# Patient Record
Sex: Female | Born: 1960 | Race: White | Hispanic: No | Marital: Married | Smoking: Never smoker
Health system: Southern US, Community
[De-identification: ages and names within clinical notes are randomized; demographics above are authoritative.]

## PROBLEM LIST (undated history)

## (undated) DIAGNOSIS — J45909 Unspecified asthma, uncomplicated: Secondary | ICD-10-CM

## (undated) DIAGNOSIS — T7840XA Allergy, unspecified, initial encounter: Secondary | ICD-10-CM

## (undated) DIAGNOSIS — G43909 Migraine, unspecified, not intractable, without status migrainosus: Secondary | ICD-10-CM

## (undated) HISTORY — DX: Allergy, unspecified, initial encounter: T78.40XA

## (undated) HISTORY — DX: Migraine, unspecified, not intractable, without status migrainosus: G43.909

## (undated) HISTORY — DX: Unspecified asthma, uncomplicated: J45.909

---

## 1998-04-15 ENCOUNTER — Other Ambulatory Visit: Admission: RE | Admit: 1998-04-15 | Discharge: 1998-04-15 | Payer: Self-pay | Admitting: Obstetrics and Gynecology

## 1998-05-18 HISTORY — PX: ABDOMINAL HYSTERECTOMY: SHX81

## 1999-04-07 ENCOUNTER — Ambulatory Visit: Admission: RE | Admit: 1999-04-07 | Discharge: 1999-04-07 | Payer: Self-pay | Admitting: Obstetrics and Gynecology

## 1999-05-27 ENCOUNTER — Inpatient Hospital Stay (HOSPITAL_COMMUNITY): Admission: RE | Admit: 1999-05-27 | Discharge: 1999-05-29 | Payer: Self-pay | Admitting: Obstetrics and Gynecology

## 1999-11-11 ENCOUNTER — Other Ambulatory Visit: Admission: RE | Admit: 1999-11-11 | Discharge: 1999-11-11 | Payer: Self-pay | Admitting: Obstetrics and Gynecology

## 2000-11-19 ENCOUNTER — Other Ambulatory Visit: Admission: RE | Admit: 2000-11-19 | Discharge: 2000-11-19 | Payer: Self-pay | Admitting: Obstetrics and Gynecology

## 2000-11-19 ENCOUNTER — Encounter: Admission: RE | Admit: 2000-11-19 | Discharge: 2000-11-19 | Payer: Self-pay | Admitting: Obstetrics and Gynecology

## 2000-11-19 ENCOUNTER — Encounter: Payer: Self-pay | Admitting: Obstetrics and Gynecology

## 2002-01-11 ENCOUNTER — Other Ambulatory Visit: Admission: RE | Admit: 2002-01-11 | Discharge: 2002-01-11 | Payer: Self-pay | Admitting: Obstetrics and Gynecology

## 2003-04-16 ENCOUNTER — Encounter: Admission: RE | Admit: 2003-04-16 | Discharge: 2003-04-16 | Payer: Self-pay | Admitting: Obstetrics and Gynecology

## 2003-04-17 ENCOUNTER — Other Ambulatory Visit: Admission: RE | Admit: 2003-04-17 | Discharge: 2003-04-17 | Payer: Self-pay | Admitting: Obstetrics and Gynecology

## 2004-04-16 ENCOUNTER — Encounter: Admission: RE | Admit: 2004-04-16 | Discharge: 2004-04-16 | Payer: Self-pay | Admitting: Obstetrics and Gynecology

## 2005-04-22 ENCOUNTER — Encounter: Admission: RE | Admit: 2005-04-22 | Discharge: 2005-04-22 | Payer: Self-pay | Admitting: Obstetrics and Gynecology

## 2006-04-29 ENCOUNTER — Encounter: Admission: RE | Admit: 2006-04-29 | Discharge: 2006-04-29 | Payer: Self-pay | Admitting: Obstetrics and Gynecology

## 2007-05-02 ENCOUNTER — Encounter: Admission: RE | Admit: 2007-05-02 | Discharge: 2007-05-02 | Payer: Self-pay | Admitting: Obstetrics and Gynecology

## 2008-06-04 ENCOUNTER — Encounter: Admission: RE | Admit: 2008-06-04 | Discharge: 2008-06-04 | Payer: Self-pay | Admitting: Obstetrics and Gynecology

## 2009-08-15 ENCOUNTER — Encounter: Admission: RE | Admit: 2009-08-15 | Discharge: 2009-08-15 | Payer: Self-pay | Admitting: Obstetrics and Gynecology

## 2010-09-17 ENCOUNTER — Other Ambulatory Visit: Payer: Self-pay | Admitting: Obstetrics and Gynecology

## 2010-09-17 DIAGNOSIS — Z1231 Encounter for screening mammogram for malignant neoplasm of breast: Secondary | ICD-10-CM

## 2010-10-21 ENCOUNTER — Ambulatory Visit
Admission: RE | Admit: 2010-10-21 | Discharge: 2010-10-21 | Disposition: A | Discharge status: Home / Self Care | Payer: BC Managed Care – PPO | Source: Ambulatory Visit | Attending: Obstetrics and Gynecology | Admitting: Obstetrics and Gynecology

## 2010-10-21 DIAGNOSIS — Z1231 Encounter for screening mammogram for malignant neoplasm of breast: Secondary | ICD-10-CM

## 2011-10-22 ENCOUNTER — Other Ambulatory Visit: Payer: Self-pay | Admitting: Obstetrics and Gynecology

## 2011-10-22 DIAGNOSIS — Z1231 Encounter for screening mammogram for malignant neoplasm of breast: Secondary | ICD-10-CM

## 2011-11-04 ENCOUNTER — Ambulatory Visit: Payer: BC Managed Care – PPO

## 2011-11-05 ENCOUNTER — Ambulatory Visit
Admission: RE | Admit: 2011-11-05 | Discharge: 2011-11-05 | Disposition: A | Discharge status: Home / Self Care | Payer: BC Managed Care – PPO | Source: Ambulatory Visit | Attending: Obstetrics and Gynecology | Admitting: Obstetrics and Gynecology

## 2011-11-05 DIAGNOSIS — Z1231 Encounter for screening mammogram for malignant neoplasm of breast: Secondary | ICD-10-CM

## 2012-09-29 ENCOUNTER — Other Ambulatory Visit: Payer: Self-pay

## 2012-09-29 DIAGNOSIS — Z1231 Encounter for screening mammogram for malignant neoplasm of breast: Secondary | ICD-10-CM

## 2012-11-07 ENCOUNTER — Ambulatory Visit
Admission: RE | Admit: 2012-11-07 | Discharge: 2012-11-07 | Disposition: A | Discharge status: Home / Self Care | Payer: BC Managed Care – PPO | Source: Ambulatory Visit

## 2012-11-07 DIAGNOSIS — Z1231 Encounter for screening mammogram for malignant neoplasm of breast: Secondary | ICD-10-CM

## 2012-11-08 ENCOUNTER — Other Ambulatory Visit: Payer: Self-pay | Admitting: Obstetrics and Gynecology

## 2012-11-08 DIAGNOSIS — R928 Other abnormal and inconclusive findings on diagnostic imaging of breast: Secondary | ICD-10-CM

## 2012-11-21 ENCOUNTER — Ambulatory Visit
Admission: RE | Admit: 2012-11-21 | Discharge: 2012-11-21 | Disposition: A | Discharge status: Home / Self Care | Payer: BC Managed Care – PPO | Source: Ambulatory Visit | Attending: Obstetrics and Gynecology | Admitting: Obstetrics and Gynecology

## 2012-11-21 DIAGNOSIS — R928 Other abnormal and inconclusive findings on diagnostic imaging of breast: Secondary | ICD-10-CM

## 2013-06-05 ENCOUNTER — Other Ambulatory Visit: Payer: Self-pay | Admitting: Obstetrics and Gynecology

## 2013-06-05 DIAGNOSIS — N6310 Unspecified lump in the right breast, unspecified quadrant: Secondary | ICD-10-CM

## 2013-06-09 ENCOUNTER — Ambulatory Visit
Admission: RE | Admit: 2013-06-09 | Discharge: 2013-06-09 | Disposition: A | Discharge status: Home / Self Care | Payer: BC Managed Care – PPO | Source: Ambulatory Visit | Attending: Obstetrics and Gynecology | Admitting: Obstetrics and Gynecology

## 2013-06-09 DIAGNOSIS — N6310 Unspecified lump in the right breast, unspecified quadrant: Secondary | ICD-10-CM

## 2013-12-08 ENCOUNTER — Other Ambulatory Visit: Payer: Self-pay

## 2013-12-08 DIAGNOSIS — Z1231 Encounter for screening mammogram for malignant neoplasm of breast: Secondary | ICD-10-CM

## 2014-01-08 ENCOUNTER — Encounter (INDEPENDENT_AMBULATORY_CARE_PROVIDER_SITE_OTHER): Payer: Self-pay

## 2014-01-08 ENCOUNTER — Ambulatory Visit
Admission: RE | Admit: 2014-01-08 | Discharge: 2014-01-08 | Disposition: A | Discharge status: Home / Self Care | Payer: BC Managed Care – PPO | Source: Ambulatory Visit

## 2014-01-08 DIAGNOSIS — Z1231 Encounter for screening mammogram for malignant neoplasm of breast: Secondary | ICD-10-CM

## 2015-04-16 ENCOUNTER — Other Ambulatory Visit: Payer: Self-pay

## 2015-04-16 DIAGNOSIS — Z1231 Encounter for screening mammogram for malignant neoplasm of breast: Secondary | ICD-10-CM

## 2015-05-02 ENCOUNTER — Ambulatory Visit
Admission: RE | Admit: 2015-05-02 | Discharge: 2015-05-02 | Disposition: A | Discharge status: Home / Self Care | Payer: BC Managed Care – PPO | Source: Ambulatory Visit

## 2015-05-02 DIAGNOSIS — Z1231 Encounter for screening mammogram for malignant neoplasm of breast: Secondary | ICD-10-CM

## 2015-06-10 ENCOUNTER — Ambulatory Visit: Payer: BC Managed Care – PPO | Admitting: Nurse Practitioner

## 2015-07-01 ENCOUNTER — Encounter: Payer: Self-pay | Admitting: Nurse Practitioner

## 2015-07-01 ENCOUNTER — Ambulatory Visit (INDEPENDENT_AMBULATORY_CARE_PROVIDER_SITE_OTHER): Payer: BC Managed Care – PPO | Admitting: Nurse Practitioner

## 2015-07-01 VITALS — BP 100/82 | HR 74 | Temp 97.9°F | Ht 65.5 in | Wt 181.5 lb

## 2015-07-01 DIAGNOSIS — J4520 Mild intermittent asthma, uncomplicated: Secondary | ICD-10-CM

## 2015-07-01 DIAGNOSIS — Z79890 Hormone replacement therapy (postmenopausal): Secondary | ICD-10-CM | POA: Diagnosis not present

## 2015-07-01 DIAGNOSIS — Z7689 Persons encountering health services in other specified circumstances: Secondary | ICD-10-CM

## 2015-07-01 DIAGNOSIS — Z7189 Other specified counseling: Secondary | ICD-10-CM | POA: Diagnosis not present

## 2015-07-01 DIAGNOSIS — G43809 Other migraine, not intractable, without status migrainosus: Secondary | ICD-10-CM

## 2015-07-01 MED ORDER — ESTRADIOL 0.05 MG/24HR TD PTWK: 0.0500 mg | MEDICATED_PATCH | TRANSDERMAL | Status: DC

## 2015-07-01 NOTE — Progress Notes (Signed)
Patient ID: Kathleen Santos, female    DOB: May 11, 1961  Age: 55 y.o. MRN: [id number]  CC: Establish Care   HPI Kathleen Santos presents for CC of establishing care and CC of estrogen patches.   1) New Pt Info:   Immunizations- UTD  Mammogram- Dec. 2016   Pap- 2015 OB/GYN Dr. Billy Santos   Colonoscopy- Had scheduled twice, but couldn't take the prep   Eye exam [facility]- glasses   2) Chronic Problems- Asthma- Childhood  Allergies- Had allergy shots, allergy meds OTC in the fall  UTI's infrequent  Migraines- started having them in last 2 years, 2 episodes   Auras, vomiting, photophobia  Ibuprofen and rest helpful, not tried other medications, does not wish to start any new medications at this time  3) Acute Problems- Estrogen Patches- has been on different strengths since her hysterectomy in 2000. Her PCP switched her from 0.1 to 0.075 the next lowest dosage. She had her first hot flash the other day and it had not happened before. She also notices some "grouchiness" when the patch is due to be changed. She would like to continue to wean off of these due to side effects and adverse reactions.   History Kathleen Santos has no past medical history on file.   She has past surgical history that includes Abdominal hysterectomy.   Her family history includes COPD in her father; Cancer in her father; Diabetes in her mother; Heart disease in her mother; Osteoarthritis in her mother; Stroke (age of onset: 34) in her mother; Throat cancer in her father.She reports that she has never smoked. She does not have any smokeless tobacco history on file. She reports that she does not drink alcohol or use illicit drugs.  No outpatient prescriptions prior to visit.   No facility-administered medications prior to visit.    ROS Review of Systems  Constitutional: Negative for fever, chills, diaphoresis and fatigue.  Respiratory: Negative for chest tightness, shortness of breath and wheezing.    Cardiovascular: Negative for chest pain, palpitations and leg swelling.  Gastrointestinal: Negative for nausea, vomiting and diarrhea.  Skin: Negative for rash.  Neurological: Positive for headaches. Negative for dizziness, weakness and numbness.  Psychiatric/Behavioral: The patient is not nervous/anxious.     Objective:  BP 100/82 mmHg  Pulse 74  Temp(Src) 97.9 F (36.6 C) (Oral)  Ht 5' 5.5" (1.664 m)  Wt 181 lb 8 oz (82.328 kg)  BMI 29.73 kg/m2  SpO2 98%  Physical Exam  Constitutional: She is oriented to person, place, and time. She appears well-developed and well-nourished. No distress.  HENT:  Head: Normocephalic and atraumatic.  Right Ear: External ear normal.  Left Ear: External ear normal.  Cardiovascular: Normal rate, regular rhythm and normal heart sounds.  Exam reveals no gallop and no friction rub.   No murmur heard. Pulmonary/Chest: Effort normal and breath sounds normal. No respiratory distress. She has no wheezes. She has no rales. She exhibits no tenderness.  Neurological: She is alert and oriented to person, place, and time. No cranial nerve deficit. She exhibits normal muscle tone. Coordination normal.  Skin: Skin is warm and dry. No rash noted. She is not diaphoretic.  Psychiatric: She has a normal mood and affect. Her behavior is normal. Judgment and thought content normal.   Assessment & Plan:   There are no diagnoses linked to this encounter. I am having Kathleen Santos maintain her estradiol.  Meds ordered this encounter  Medications  . estradiol (CLIMARA - DOSED IN  MG/24 HR) 0.075 mg/24hr patch    Sig: Place 0.075 mg onto the skin 2 (two) times a week.     Follow-up: No Follow-up on file.

## 2015-07-01 NOTE — Patient Instructions (Signed)
Welcome to [facility]! Nice to meet you.   See you when you need [place]!

## 2015-07-01 NOTE — Progress Notes (Signed)
Pre visit review using our clinic review tool, if applicable. No additional management support is needed unless otherwise documented below in the visit note. 

## 2015-07-05 DIAGNOSIS — Z7689 Persons encountering health services in other specified circumstances: Secondary | ICD-10-CM | POA: Insufficient documentation

## 2015-07-05 DIAGNOSIS — Z79890 Hormone replacement therapy (postmenopausal): Secondary | ICD-10-CM | POA: Insufficient documentation

## 2015-07-05 DIAGNOSIS — J45909 Unspecified asthma, uncomplicated: Secondary | ICD-10-CM | POA: Insufficient documentation

## 2015-07-05 DIAGNOSIS — G43909 Migraine, unspecified, not intractable, without status migrainosus: Secondary | ICD-10-CM | POA: Insufficient documentation

## 2015-07-05 NOTE — Assessment & Plan Note (Signed)
Discussed acute and chronic issues. Reviewed health maintenance measures, PFSHx, and immunizations. Obtain records from previous facility.   

## 2015-07-05 NOTE — Assessment & Plan Note (Signed)
New problem to me Patient has started them within last 2 years and has had 2 episodes approximately Patient does have are auras, vomiting, photophobia Will follow as needed

## 2015-07-05 NOTE — Assessment & Plan Note (Signed)
New problem to me Patient has started on estrogen patches back in 2000 by her OB/GYN Patient was decreased to the next level by her PCP back last year Patient reports that she is having some grouchiness and had one hot flash episode recently She would like to continue to wean off of these because she is afraid of heart attack and stroke risks I have called in the next lower dose to her pharmacy to start whenever she is ready will decrease over the next several months

## 2015-07-05 NOTE — Assessment & Plan Note (Signed)
Patient is stable currently Has not used an inhaler since childhood Patient also went through allergy shots and uses OTC allergy meds in the fall which is helpful for patient We'll follow as needed

## 2016-03-10 ENCOUNTER — Other Ambulatory Visit: Payer: Self-pay | Admitting: Obstetrics and Gynecology

## 2016-03-10 DIAGNOSIS — Z1231 Encounter for screening mammogram for malignant neoplasm of breast: Secondary | ICD-10-CM

## 2016-05-08 ENCOUNTER — Ambulatory Visit
Admission: RE | Admit: 2016-05-08 | Discharge: 2016-05-08 | Disposition: A | Discharge status: Home / Self Care | Payer: Self-pay | Source: Ambulatory Visit | Attending: Obstetrics and Gynecology | Admitting: Obstetrics and Gynecology

## 2016-05-08 DIAGNOSIS — Z1231 Encounter for screening mammogram for malignant neoplasm of breast: Secondary | ICD-10-CM

## 2016-05-20 ENCOUNTER — Encounter: Payer: Self-pay | Admitting: Internal Medicine

## 2016-05-20 ENCOUNTER — Ambulatory Visit (INDEPENDENT_AMBULATORY_CARE_PROVIDER_SITE_OTHER): Payer: BC Managed Care – PPO | Admitting: Internal Medicine

## 2016-05-20 DIAGNOSIS — G43809 Other migraine, not intractable, without status migrainosus: Secondary | ICD-10-CM

## 2016-05-20 DIAGNOSIS — R232 Flushing: Secondary | ICD-10-CM | POA: Diagnosis not present

## 2016-05-20 MED ORDER — ESTRADIOL 0.05 MG/24HR TD PTWK: 0.0500 mg | MEDICATED_PATCH | TRANSDERMAL | 11 refills | Status: DC

## 2016-05-20 NOTE — Assessment & Plan Note (Signed)
Estradiol patch refilled today

## 2016-05-20 NOTE — Progress Notes (Signed)
HPI  Pt presents to the clinic today to establish care. She is transferring care from [facility].   Seasonal Allergies: She reports she took allergy shots for a long time in high school. She has not had any issues since college. She does not taken anything OTC for this.  Migraines: She reports she has not had a migraine in the last 6 months. They are usually located on the top of her head. She does have associated sensitivity to light, nausea and migraines. She takes Excedrin Migraine with good relief.   Hot Flashes: She takes Estradiol patch with good relief. She is requesting a refill of this today.  Flu: 03/2016 Tetanus: 05/2014 Pap Smear: 2015, total hysterectomy Mammogram: [facility] Colon Screening: never Vision Screening: annually 05/2015, has an appt scheduled for 05/2016 Dentist: biannually   Past Medical History:  Diagnosis Date  . Allergy   . Asthma    Childhood  . Migraine     Current Outpatient Prescriptions  Medication Sig Dispense Refill  . estradiol (CLIMARA - DOSED IN MG/24 HR) 0.05 mg/24hr patch Place 1 patch (0.05 mg total) onto the skin once a week. 4 patch 11  . Multiple Vitamin (MULTIVITAMIN) tablet Take 1 tablet by mouth daily.     No current facility-administered medications for this visit.     No Known Allergies  Family History  Problem Relation Age of Onset  . Stroke Mother 73  . Osteoarthritis Mother   . Diabetes Mother   . Heart disease Mother   . Arthritis Mother   . COPD Father   . Throat cancer Father   . Cancer Father   . Heart disease Father   . Cancer Sister     Breast  . Cancer Brother     Prostate  . Cancer Sister     Breast    Social History   Social History  . Marital status: Married    Spouse name: N/A  . Number of children: N/A  . Years of education: N/A   Occupational History  . Not on file.   Social History Main Topics  . Smoking status: Never Smoker  . Smokeless tobacco: Never Used  . Alcohol  use No  . Drug use: No  . Sexual activity: Yes    Partners: Male    Birth control/ protection: Surgical     Comment: Husband   Other Topics Concern  . Not on file   Social History Narrative   Married   1 year of college   [employer] healthcare- Surgery scheduler    No Children    Caffeine- coffee 2 cups, tea prn   Counseling- 17 years ago after mother passing     ROS:  Constitutional: Denies fever, malaise, fatigue, headache or abrupt weight changes.  HEENT: Denies eye pain, eye redness, ear pain, ringing in the ears, wax buildup, runny nose, nasal congestion, bloody nose, or sore throat. Respiratory: Denies difficulty breathing, shortness of breath, cough or sputum production.   Cardiovascular: Denies chest pain, chest tightness, palpitations or swelling in the hands or feet.  Gastrointestinal: Denies abdominal pain, bloating, constipation, diarrhea or blood in the stool.  GU: Denies frequency, urgency, pain with urination, blood in urine, odor or discharge. Musculoskeletal: Denies decrease in range of motion, difficulty with gait, muscle pain or joint pain and swelling.  Skin: Denies redness, rashes, lesions or ulcercations.  Neurological: Pt reports hot flashes. Denies dizziness, difficulty with memory, difficulty with speech or problems with balance  and coordination.  Psych: Denies anxiety, depression, SI/HI.  No other specific complaints in a complete review of systems (except as listed in HPI above).  PE:  BP 108/70   Pulse 71   Temp 98.1 F (36.7 C) (Oral)   Ht 5\' 5"  (1.651 m)   Wt 188 lb (85.3 kg)   SpO2 97%   BMI 31.28 kg/m  Wt Readings from Last 3 Encounters:  05/20/16 188 lb (85.3 kg)  07/01/15 181 lb 8 oz (82.3 kg)    General: Appears herstated age, well developed, well nourished in NAD. Cardiovascular: Normal rate and rhythm. S1,S2 noted.   Pulmonary/Chest: Normal effort and positive vesicular breath sounds. No respiratory distress. No wheezes, rales or  ronchi noted.  Musculoskeletal:  No difficulty with gait.  Neurological: Alert and oriented.  Psychiatric: Mood and affect normal. Behavior is normal. Judgment and thought content normal.    Assessment and Plan:

## 2016-05-20 NOTE — Patient Instructions (Signed)
Menopause Menopause is the normal time of life when menstrual periods stop completely. Menopause is complete when you have missed 12 consecutive menstrual periods. It usually occurs between the ages of 48 years and 55 years. Very rarely does a woman develop menopause before the age of 40 years. At menopause, your ovaries stop producing the female hormones estrogen and progesterone. This can cause undesirable symptoms and also affect your health. Sometimes the symptoms may occur 4-5 years before the menopause begins. There is no relationship between menopause and:  Oral contraceptives.  Number of children you had.  Race.  The age your menstrual periods started (menarche).  Heavy smokers and very thin women may develop menopause earlier in life. What are the causes?  The ovaries stop producing the female hormones estrogen and progesterone. Other causes include:  Surgery to remove both ovaries.  The ovaries stop functioning for no known reason.  Tumors of the pituitary gland in the brain.  Medical disease that affects the ovaries and hormone production.  Radiation treatment to the abdomen or pelvis.  Chemotherapy that affects the ovaries.  What are the signs or symptoms?  Hot flashes.  Night sweats.  Decrease in sex drive.  Vaginal dryness and thinning of the vagina causing painful intercourse.  Dryness of the skin and developing wrinkles.  Headaches.  Tiredness.  Irritability.  Memory problems.  Weight gain.  Bladder infections.  Hair growth of the face and chest.  Infertility. More serious symptoms include:  Loss of bone (osteoporosis) causing breaks (fractures).  Depression.  Hardening and narrowing of the arteries (atherosclerosis) causing heart attacks and strokes.  How is this diagnosed?  When the menstrual periods have stopped for 12 straight months.  Physical exam.  Hormone studies of the blood. How is this treated? There are many treatment  choices and nearly as many questions about them. The decisions to treat or not to treat menopausal changes is an individual choice made with your health care provider. Your health care provider can discuss the treatments with you. Together, you can decide which treatment will work best for you. Your treatment choices may include:  Hormone therapy (estrogen and progesterone).  Non-hormonal medicines.  Treating the individual symptoms with medicine (for example antidepressants for depression).  Herbal medicines that may help specific symptoms.  Counseling by a psychiatrist or psychologist.  Group therapy.  Lifestyle changes including: ? Eating healthy. ? Regular exercise. ? Limiting caffeine and alcohol. ? Stress management and meditation.  No treatment.  Follow these instructions at home:  Take the medicine your health care provider gives you as directed.  Get plenty of sleep and rest.  Exercise regularly.  Eat a diet that contains calcium (good for the bones) and soy products (acts like estrogen hormone).  Avoid alcoholic beverages.  Do not smoke.  If you have hot flashes, dress in layers.  Take supplements, calcium, and vitamin D to strengthen bones.  You can use over-the-counter lubricants or moisturizers for vaginal dryness.  Group therapy is sometimes very helpful.  Acupuncture may be helpful in some cases. Contact a health care provider if:  You are not sure you are in menopause.  You are having menopausal symptoms and need advice and treatment.  You are still having menstrual periods after age 55 years.  You have pain with intercourse.  Menopause is complete (no menstrual period for 12 months) and you develop vaginal bleeding.  You need a referral to a specialist (gynecologist, psychiatrist, or psychologist) for treatment. Get help right   away if:  You have severe depression.  You have excessive vaginal bleeding.  You fell and think you have a  broken bone.  You have pain when you urinate.  You develop leg or chest pain.  You have a fast pounding heart beat (palpitations).  You have severe headaches.  You develop vision problems.  You feel a lump in your breast.  You have abdominal pain or severe indigestion. This information is not intended to replace advice given to you by your health care provider. Make sure you discuss any questions you have with your health care provider. Document Released: 07/25/2003 Document Revised: 10/10/2015 Document Reviewed: 12/01/2012 Elsevier Interactive Patient Education  2017 Elsevier Inc.  

## 2016-05-20 NOTE — Assessment & Plan Note (Signed)
Controlled with Excedrin Migraine Will monitor 

## 2016-06-05 ENCOUNTER — Ambulatory Visit: Payer: Self-pay | Admitting: Family Medicine

## 2016-06-24 ENCOUNTER — Other Ambulatory Visit: Payer: Self-pay | Admitting: Nurse Practitioner

## 2016-06-24 NOTE — Telephone Encounter (Signed)
Pt last refill on estradiol was on 05/27/16. Pt has no appt to est care with new PCP.

## 2016-08-31 ENCOUNTER — Encounter: Payer: Self-pay | Admitting: Internal Medicine

## 2016-08-31 ENCOUNTER — Telehealth: Payer: Self-pay

## 2016-08-31 ENCOUNTER — Ambulatory Visit (INDEPENDENT_AMBULATORY_CARE_PROVIDER_SITE_OTHER): Payer: BC Managed Care – PPO | Admitting: Internal Medicine

## 2016-08-31 VITALS — BP 104/70 | HR 71 | Temp 98.3°F | Ht 65.0 in | Wt 183.5 lb

## 2016-08-31 DIAGNOSIS — Z780 Asymptomatic menopausal state: Secondary | ICD-10-CM

## 2016-08-31 DIAGNOSIS — K219 Gastro-esophageal reflux disease without esophagitis: Secondary | ICD-10-CM

## 2016-08-31 DIAGNOSIS — Z13820 Encounter for screening for osteoporosis: Secondary | ICD-10-CM

## 2016-08-31 DIAGNOSIS — Z0000 Encounter for general adult medical examination without abnormal findings: Secondary | ICD-10-CM | POA: Diagnosis not present

## 2016-08-31 LAB — COMPREHENSIVE METABOLIC PANEL
ALBUMIN: 4 g/dL (ref 3.5–5.2)
ALK PHOS: 50 U/L (ref 39–117)
ALT: 13 U/L (ref 0–35)
AST: 21 U/L (ref 0–37)
BUN: 17 mg/dL (ref 6–23)
CO2: 28 mEq/L (ref 19–32)
Calcium: 9.2 mg/dL (ref 8.4–10.5)
Chloride: 107 mEq/L (ref 96–112)
Creatinine, Ser: 0.93 mg/dL (ref 0.40–1.20)
GFR: 66.21 mL/min (ref >60.00)
Glucose, Bld: 90 mg/dL (ref 70–99)
Potassium: 4.2 mEq/L (ref 3.5–5.1)
SODIUM: 141 meq/L (ref 135–145)
TOTAL PROTEIN: 6.9 g/dL (ref 6.0–8.3)
Total Bilirubin: 0.4 mg/dL (ref 0.2–1.2)

## 2016-08-31 LAB — LIPID PANEL
Cholesterol: 139 mg/dL (ref 0–200)
HDL: 47.8 mg/dL (ref >39.00)
LDL CALC: 76 mg/dL (ref 0–99)
NONHDL: 91.19
Total CHOL/HDL Ratio: 3
Triglycerides: 75 mg/dL (ref 0.0–149.0)
VLDL: 15 mg/dL (ref 0.0–40.0)

## 2016-08-31 LAB — CBC
HCT: 40.1 % (ref 36.0–46.0)
Hemoglobin: 13.3 g/dL (ref 12.0–15.0)
MCHC: 33.1 g/dL (ref 30.0–36.0)
MCV: 91.2 fl (ref 78.0–100.0)
Platelets: 303 10*3/uL (ref 150.0–400.0)
RBC: 4.4 Mil/uL (ref 3.87–5.11)
RDW: 13.7 % (ref 11.5–15.5)
WBC: 6.5 10*3/uL (ref 4.0–10.5)

## 2016-08-31 LAB — VITAMIN D 25 HYDROXY (VIT D DEFICIENCY, FRACTURES): VITD: 31.28 ng/mL (ref 30.00–100.00)

## 2016-08-31 NOTE — Progress Notes (Signed)
Subjective:    Patient ID: Kathleen Santos, female    DOB: 07-10-1960, 56 y.o.   MRN: [id number]  HPI  Pt presents to the clinic today for her annual exam.  Flu: 03/2016 Tetanus: 05/2014 Pap Smear: 2015, total hysterectomy Mammogram: 04/2016 Bone Density: never Colon Screening: never Vision Screening: annually, 05/2016 Dentist: biannually  Diet: She does eat meat. She consumes fruits and veggies daily. She tries to avoid fried foods. She drinks mostly water. Exercise: She is walking for about 30 minutes 5 days a week.  Review of Systems      Past Medical History:  Diagnosis Date  . Allergy   . Asthma    Childhood  . Migraine     Current Outpatient Prescriptions  Medication Sig Dispense Refill  . estradiol (CLIMARA - DOSED IN MG/24 HR) 0.05 mg/24hr patch PLACE 1 PATCH (0.05 MG TOTAL) ONTO THE SKIN ONCE A WEEK. 4 patch 1  . Multiple Vitamin (MULTIVITAMIN) tablet Take 1 tablet by mouth daily.     No current facility-administered medications for this visit.     No Known Allergies  Family History  Problem Relation Age of Onset  . Stroke Mother 51  . Osteoarthritis Mother   . Diabetes Mother   . Heart disease Mother   . Arthritis Mother   . COPD Father   . Throat cancer Father   . Cancer Father   . Heart disease Father   . Cancer Sister     Breast  . Cancer Brother     Prostate  . Cancer Sister     Breast    Social History   Social History  . Marital status: Married    Spouse name: N/A  . Number of children: N/A  . Years of education: N/A   Occupational History  . Not on file.   Social History Main Topics  . Smoking status: Never Smoker  . Smokeless tobacco: Never Used  . Alcohol use No  . Drug use: No  . Sexual activity: Yes    Partners: Male    Birth control/ protection: Surgical     Comment: Husband   Other Topics Concern  . Not on file   Social History Narrative   Married   1 year of college   [employer] healthcare- Surgery scheduler     No Children    Caffeine- coffee 2 cups, tea prn   Counseling- 17 years ago after mother passing      Constitutional: Denies fever, malaise, fatigue, headache or abrupt weight changes.  HEENT: Denies eye pain, eye redness, ear pain, ringing in the ears, wax buildup, runny nose, nasal congestion, bloody nose, or sore throat. Respiratory: Denies difficulty breathing, shortness of breath, cough or sputum production.   Cardiovascular: Denies chest pain, chest tightness, palpitations or swelling in the hands or feet.  Gastrointestinal: Pt reports intermittent reflux. Denies abdominal pain, bloating, constipation, diarrhea or blood in the stool.  GU: Denies urgency, frequency, pain with urination, burning sensation, blood in urine, odor or discharge. Musculoskeletal: Denies decrease in range of motion, difficulty with gait, muscle pain or joint pain and swelling.  Skin: Denies redness, rashes, lesions or ulcercations.  Neurological: Pt reports intermittent hot flashes at night. Denies dizziness, difficulty with memory, difficulty with speech or problems with balance and coordination.  Psych: Pt reports intermittent anxiety. Denies depression, SI/HI.  No other specific complaints in a complete review of systems (except as listed in HPI above).  Objective:   Physical Exam  BP 104/70   Pulse 71   Temp 98.3 F (36.8 C) (Oral)   Ht  (1.651 m)   Wt 183 lb 8 oz (83.2 kg)   SpO2 98%   BMI 30.54 kg/m  Wt Readings from Last 3 Encounters:  08/31/16 183 lb 8 oz (83.2 kg)  05/20/16 188 lb (85.3 kg)  07/01/15 181 lb 8 oz (82.3 kg)    General: Appears her stated age, obese in NAD. Skin: Warm, dry and intact.  HEENT: Head: normal shape and size; Eyes: sclera white, no icterus, conjunctiva pink, PERRLA and EOMs intact; Ears: Tm's gray and intact, normal light reflex;Throat/Mouth: Teeth present, mucosa pink and moist, no exudate, lesions or ulcerations noted.  Neck:  Neck supple, trachea  midline. No masses, lumps or thyromegaly present.  Cardiovascular: Normal rate and rhythm. S1,S2 noted.  No murmur, rubs or gallops noted. No JVD or BLE edema. No carotid bruits noted. Pulmonary/Chest: Normal effort and positive vesicular breath sounds. No respiratory distress. No wheezes, rales or ronchi noted.  Abdomen: Soft and nontender. Normal bowel sounds. No distention or masses noted. Liver, spleen and kidneys non palpable. Musculoskeletal: Strength 5/5 BUE/BLE. No difficulty with gait.  Neurological: Alert and oriented. Cranial nerves II-XII grossly intact. Coordination normal.  Psychiatric: Mood and affect normal. Behavior is normal. Judgment and thought content normal.      Assessment & Plan:   Preventative Health Maintenance:  Flu and Tetanus UTD She does not need pap smear's Mammogram due 04/2017 Bone density ordered, she will schedule with mammogram 04/2017 She declines colonoscopy but is agreeable to Cologuard- ordered Encouraged her to consume a balanced diet and exercise regimen Advised her to see an eye doctor and dentist annually Will check CBC, CMET, Lipid and Vit D today  GERD:  Advised her to try to pay attention to her diet and see what triggers her symptoms, and avoid those things if possible Ok to start Prilosec OTC  RTC in 1 year, sooner if needed Nicki Reaper, NP

## 2016-08-31 NOTE — Telephone Encounter (Signed)
Cologuard form has been faxed w/ demographics and insurance card attached 

## 2016-08-31 NOTE — Patient Instructions (Signed)
Health Maintenance for Postmenopausal Women Menopause is a normal process in which your reproductive ability comes to an end. This process happens gradually over a span of months to years, usually between the ages of 33 and 38. Menopause is complete when you have missed 12 consecutive menstrual periods. It is important to talk with your health care provider about some of the most common conditions that affect postmenopausal women, such as heart disease, cancer, and bone loss (osteoporosis). Adopting a healthy lifestyle and getting preventive care can help to promote your health and wellness. Those actions can also lower your chances of developing some of these common conditions. What should I know about menopause? During menopause, you may experience a number of symptoms, such as:  Moderate-to-severe hot flashes.  Night sweats.  Decrease in sex drive.  Mood swings.  Headaches.  Tiredness.  Irritability.  Memory problems.  Insomnia. Choosing to treat or not to treat menopausal changes is an individual decision that you make with your health care provider. What should I know about hormone replacement therapy and supplements? Hormone therapy products are effective for treating symptoms that are associated with menopause, such as hot flashes and night sweats. Hormone replacement carries certain risks, especially as you become older. If you are thinking about using estrogen or estrogen with progestin treatments, discuss the benefits and risks with your health care provider. What should I know about heart disease and stroke? Heart disease, heart attack, and stroke become more likely as you age. This may be due, in part, to the hormonal changes that your body experiences during menopause. These can affect how your body processes dietary fats, triglycerides, and cholesterol. Heart attack and stroke are both medical emergencies. There are many things that you can do to help prevent heart disease  and stroke:  Have your blood pressure checked at least every 1-2 years. High blood pressure causes heart disease and increases the risk of stroke.  If you are 48-61 years old, ask your health care provider if you should take aspirin to prevent a heart attack or a stroke.  Do not use any tobacco products, including cigarettes, chewing tobacco, or electronic cigarettes. If you need help quitting, ask your health care provider.  It is important to eat a healthy diet and maintain a healthy weight.  Be sure to include plenty of vegetables, fruits, low-fat dairy products, and lean protein.  Avoid eating foods that are high in solid fats, added sugars, or salt (sodium).  Get regular exercise. This is one of the most important things that you can do for your health.  Try to exercise for at least 150 minutes each week. The type of exercise that you do should increase your heart rate and make you sweat. This is known as moderate-intensity exercise.  Try to do strengthening exercises at least twice each week. Do these in addition to the moderate-intensity exercise.  Know your numbers.Ask your health care provider to check your cholesterol and your blood glucose. Continue to have your blood tested as directed by your health care provider. What should I know about cancer screening? There are several types of cancer. Take the following steps to reduce your risk and to catch any cancer development as early as possible. Breast Cancer  Practice breast self-awareness.  This means understanding how your breasts normally appear and feel.  It also means doing regular breast self-exams. Let your health care provider know about any changes, no matter how small.  If you are 40 or older,  have a clinician do a breast exam (clinical breast exam or CBE) every year. Depending on your age, family history, and medical history, it may be recommended that you also have a yearly breast X-ray (mammogram).  If you  have a family history of breast cancer, talk with your health care provider about genetic screening.  If you are at high risk for breast cancer, talk with your health care provider about having an MRI and a mammogram every year.  Breast cancer (BRCA) gene test is recommended for women who have family members with BRCA-related cancers. Results of the assessment will determine the need for genetic counseling and BRCA1 and for BRCA2 testing. BRCA-related cancers include these types:  Breast. This occurs in males or females.  Ovarian.  Tubal. This may also be called fallopian tube cancer.  Cancer of the abdominal or pelvic lining (peritoneal cancer).  Prostate.  Pancreatic. Cervical, Uterine, and Ovarian Cancer  Your health care provider may recommend that you be screened regularly for cancer of the pelvic organs. These include your ovaries, uterus, and vagina. This screening involves a pelvic exam, which includes checking for microscopic changes to the surface of your cervix (Pap test).  For women ages 21-65, health care providers may recommend a pelvic exam and a Pap test every three years. For women ages 23-65, they may recommend the Pap test and pelvic exam, combined with testing for human papilloma virus (HPV), every five years. Some types of HPV increase your risk of cervical cancer. Testing for HPV may also be done on women of any age who have unclear Pap test results.  Other health care providers may not recommend any screening for nonpregnant women who are considered low risk for pelvic cancer and have no symptoms. Ask your health care provider if a screening pelvic exam is right for you.  If you have had past treatment for cervical cancer or a condition that could lead to cancer, you need Pap tests and screening for cancer for at least 20 years after your treatment. If Pap tests have been discontinued for you, your risk factors (such as having a new sexual partner) need to be reassessed  to determine if you should start having screenings again. Some women have medical problems that increase the chance of getting cervical cancer. In these cases, your health care provider may recommend that you have screening and Pap tests more often.  If you have a family history of uterine cancer or ovarian cancer, talk with your health care provider about genetic screening.  If you have vaginal bleeding after reaching menopause, tell your health care provider.  There are currently no reliable tests available to screen for ovarian cancer. Lung Cancer  Lung cancer screening is recommended for adults 99-83 years old who are at high risk for lung cancer because of a history of smoking. A yearly low-dose CT scan of the lungs is recommended if you:  Currently smoke.  Have a history of at least 30 pack-years of smoking and you currently smoke or have quit within the past 15 years. A pack-year is smoking an average of one pack of cigarettes per day for one year. Yearly screening should:  Continue until it has been 15 years since you quit.  Stop if you develop a health problem that would prevent you from having lung cancer treatment. Colorectal Cancer  This type of cancer can be detected and can often be prevented.  Routine colorectal cancer screening usually begins at age 72 and continues  through age 75.  If you have risk factors for colon cancer, your health care provider may recommend that you be screened at an earlier age.  If you have a family history of colorectal cancer, talk with your health care provider about genetic screening.  Your health care provider may also recommend using home test kits to check for hidden blood in your stool.  A small camera at the end of a tube can be used to examine your colon directly (sigmoidoscopy or colonoscopy). This is done to check for the earliest forms of colorectal cancer.  Direct examination of the colon should be repeated every 5-10 years until  age 75. However, if early forms of precancerous polyps or small growths are found or if you have a family history or genetic risk for colorectal cancer, you may need to be screened more often. Skin Cancer  Check your skin from head to toe regularly.  Monitor any moles. Be sure to tell your health care provider:  About any new moles or changes in moles, especially if there is a change in a mole's shape or color.  If you have a mole that is larger than the size of a pencil eraser.  If any of your family members has a history of skin cancer, especially at a young age, talk with your health care provider about genetic screening.  Always use sunscreen. Apply sunscreen liberally and repeatedly throughout the day.  Whenever you are outside, protect yourself by wearing long sleeves, pants, a wide-brimmed hat, and sunglasses. What should I know about osteoporosis? Osteoporosis is a condition in which bone destruction happens more quickly than new bone creation. After menopause, you may be at an increased risk for osteoporosis. To help prevent osteoporosis or the bone fractures that can happen because of osteoporosis, the following is recommended:  If you are 19-50 years old, get at least 1,000 mg of calcium and at least 600 mg of vitamin D per day.  If you are older than age 50 but younger than age 70, get at least 1,200 mg of calcium and at least 600 mg of vitamin D per day.  If you are older than age 70, get at least 1,200 mg of calcium and at least 800 mg of vitamin D per day. Smoking and excessive alcohol intake increase the risk of osteoporosis. Eat foods that are rich in calcium and vitamin D, and do weight-bearing exercises several times each week as directed by your health care provider. What should I know about how menopause affects my mental health? Depression may occur at any age, but it is more common as you become older. Common symptoms of depression include:  Low or sad  mood.  Changes in sleep patterns.  Changes in appetite or eating patterns.  Feeling an overall lack of motivation or enjoyment of activities that you previously enjoyed.  Frequent crying spells. Talk with your health care provider if you think that you are experiencing depression. What should I know about immunizations? It is important that you get and maintain your immunizations. These include:  Tetanus, diphtheria, and pertussis (Tdap) booster vaccine.  Influenza every year before the flu season begins.  Pneumonia vaccine.  Shingles vaccine. Your health care provider may also recommend other immunizations. This information is not intended to replace advice given to you by your health care provider. Make sure you discuss any questions you have with your health care provider. Document Released: 06/26/2005 Document Revised: 11/22/2015 Document Reviewed: 02/05/2015 Elsevier Interactive Patient   Education  2017 [employer]  

## 2016-10-12 ENCOUNTER — Encounter: Payer: Self-pay | Admitting: Internal Medicine

## 2016-10-13 ENCOUNTER — Encounter: Payer: Self-pay | Admitting: Family Medicine

## 2016-10-13 ENCOUNTER — Other Ambulatory Visit: Payer: Self-pay | Admitting: Internal Medicine

## 2016-10-13 ENCOUNTER — Ambulatory Visit (INDEPENDENT_AMBULATORY_CARE_PROVIDER_SITE_OTHER): Payer: BC Managed Care – PPO | Admitting: Family Medicine

## 2016-10-13 VITALS — BP 100/66 | HR 67 | Temp 98.2°F | Ht 65.0 in | Wt 183.0 lb

## 2016-10-13 DIAGNOSIS — Z1211 Encounter for screening for malignant neoplasm of colon: Secondary | ICD-10-CM

## 2016-10-13 DIAGNOSIS — R350 Frequency of micturition: Secondary | ICD-10-CM | POA: Diagnosis not present

## 2016-10-13 DIAGNOSIS — R310 Gross hematuria: Secondary | ICD-10-CM | POA: Insufficient documentation

## 2016-10-13 LAB — POCT UA - MICROSCOPIC ONLY

## 2016-10-13 LAB — POC URINALSYSI DIPSTICK (AUTOMATED)
Bilirubin, UA: NEGATIVE
Glucose, UA: NEGATIVE
KETONES UA: NEGATIVE
LEUKOCYTES UA: NEGATIVE
Nitrite, UA: NEGATIVE
PH UA: 6.5 (ref 5.0–8.0)
PROTEIN UA: NEGATIVE
RBC UA: NEGATIVE
SPEC GRAV UA: 1.02 (ref 1.010–1.025)
Urobilinogen, UA: 0.2 E.U./dL

## 2016-10-13 NOTE — Addendum Note (Signed)
Addended by: Damita LackLORING, DONNA S on: 10/13/2016 03:14 PM   Modules accepted: Orders

## 2016-10-13 NOTE — Progress Notes (Signed)
   Subjective:    Patient ID: Kathleen Santos, female    DOB: Feb 23, 1961, 56 y.o.   MRN: [id number]  Hematuria  This is a new problem. The current episode started yesterday. She describes the hematuria as gross hematuria. The hematuria occurs during the terminal (blood on toilet tissue) portion of her urinary stream. The pain is moderate. She describes her urine color as light pink. Irritative symptoms include frequency and urgency. Obstructive symptoms include dribbling. Obstructive symptoms do not include incomplete emptying, an intermittent stream, a slower stream, straining or a weak stream. Associated symptoms include abdominal pain, chills and nausea. Pertinent negatives include no dysuria, fever, flank pain, inability to urinate or vomiting. There is no history of hypertension, kidney stones, recent infection, STDs or tobacco use.  Urinary Frequency   This is a new problem. Episode onset: 3 day. There has been no fever. Associated symptoms include chills, frequency, hematuria, nausea and urgency. Pertinent negatives include no discharge, flank pain or vomiting. Associated symptoms comments: Low abd pain and bloating. There is no history of kidney stones.     No past uro procedures.  S/P TAH.Marland Kitchen. No-cancer reason. Review of Systems  Constitutional: Positive for chills. Negative for fever.  Gastrointestinal: Positive for abdominal pain and nausea. Negative for vomiting.  Genitourinary: Positive for frequency, hematuria and urgency. Negative for dysuria, flank pain and incomplete emptying.       Objective:   Physical Exam  Constitutional: Vital signs are normal. She appears well-developed and well-nourished. She is cooperative.  Non-toxic appearance. She does not appear ill. No distress.  HENT:  Head: Normocephalic.  Right Ear: Hearing, tympanic membrane, external ear and ear canal normal. Tympanic membrane is not erythematous, not retracted and not bulging.  Left Ear: Hearing, tympanic  membrane, external ear and ear canal normal. Tympanic membrane is not erythematous, not retracted and not bulging.  Nose: No mucosal edema or rhinorrhea. Right sinus exhibits no maxillary sinus tenderness and no frontal sinus tenderness. Left sinus exhibits no maxillary sinus tenderness and no frontal sinus tenderness.  Mouth/Throat: Uvula is midline, oropharynx is clear and moist and mucous membranes are normal.  Eyes: Conjunctivae, EOM and lids are normal. Pupils are equal, round, and reactive to light. Lids are everted and swept, no foreign bodies found.  Neck: Trachea normal and normal range of motion. Neck supple. Carotid bruit is not present. No thyroid mass and no thyromegaly present.  Cardiovascular: Normal rate, regular rhythm, S1 normal, S2 normal, normal heart sounds, intact distal pulses and normal pulses.  Exam reveals no gallop and no friction rub.   No murmur heard. Pulmonary/Chest: Effort normal and breath sounds normal. No tachypnea. No respiratory distress. She has no decreased breath sounds. She has no wheezes. She has no rhonchi. She has no rales.  Abdominal: Soft. Normal appearance and bowel sounds are normal. There is tenderness in the suprapubic area.  Neurological: She is alert.  Skin: Skin is warm, dry and intact. No rash noted.  Psychiatric: Her speech is normal and behavior is normal. Judgment and thought content normal. Her mood appears not anxious. Cognition and memory are normal. She does not exhibit a depressed mood.          Assessment & Plan:

## 2016-10-13 NOTE — Patient Instructions (Signed)
Continue to push fluids.  We will call with culture results.  Call for referral to urology if blood in urine recurs if urine culture is negative for infection.

## 2016-10-13 NOTE — Assessment & Plan Note (Signed)
Possoibel UTI.Marland Kitchen. No clear sign of stone.  Send urine for culture.. If negative for infections pt needs further eval by urology.

## 2016-10-14 ENCOUNTER — Ambulatory Visit: Payer: BC Managed Care – PPO | Admitting: Internal Medicine

## 2016-10-15 ENCOUNTER — Telehealth: Payer: Self-pay | Admitting: *Deleted

## 2016-10-15 MED ORDER — CIPROFLOXACIN HCL 250 MG PO TABS: 250.0000 mg | ORAL_TABLET | Freq: Two times a day (BID) | ORAL | 0 refills | Status: DC

## 2016-10-15 NOTE — Telephone Encounter (Signed)
Ms. Alvan DameLongest notified as instructed by telephone.  Cipro 250 mg prescription [facility] [place] as instructed by Dr. Ermalene SearingBedsole.

## 2016-10-15 NOTE — Telephone Encounter (Signed)
-----   Message from Excell SeltzerAmy E Bedsole, MD sent at 10/15/2016  4:35 PM EDT ----- Call patient.. Given  ECOli.. I think we need  Treat with antibitoics.. Call in cipro 250 mg BID x 5 days, #10, 0RF.Marland Kitchen. If susceptibilities show resistance we may have to change, but not safe to wait longer.

## 2016-10-16 LAB — URINE CULTURE

## 2016-12-31 LAB — HM DIABETES EYE EXAM

## 2016-12-31 LAB — HM COLONOSCOPY

## 2017-01-22 ENCOUNTER — Encounter: Payer: Self-pay | Admitting: Internal Medicine

## 2017-03-24 ENCOUNTER — Encounter: Payer: Self-pay | Admitting: Internal Medicine

## 2017-05-07 ENCOUNTER — Telehealth: Payer: Self-pay | Admitting: Internal Medicine

## 2017-05-07 DIAGNOSIS — E2839 Other primary ovarian failure: Secondary | ICD-10-CM

## 2017-05-07 NOTE — Addendum Note (Signed)
Addended by: Lorre MunroeBAITY, REGINA W on: 05/07/2017 03:05 PM   Modules accepted: Orders

## 2017-05-07 NOTE — Telephone Encounter (Signed)
Copied from CRM [phone]. Topic: Quick Communication - See Telephone Encounter >> May 07, 2017  2:52 PM Arlyss Gandyichardson, Rajvir Ernster N, NT wrote: CRM for notification. See Telephone encounter for: [facility] and the dx for the bone density scan needs to be changed. Screen for osteoprosis is not covered by insurance. More common dx is estrogen deficiency. Please call Faby CB#: [phone] opt 1 then opt 2.   05/07/17.

## 2017-05-07 NOTE — Telephone Encounter (Signed)
Order has been changed

## 2017-05-17 ENCOUNTER — Other Ambulatory Visit: Payer: Self-pay | Admitting: Internal Medicine

## 2017-05-17 DIAGNOSIS — Z1231 Encounter for screening mammogram for malignant neoplasm of breast: Secondary | ICD-10-CM

## 2017-06-03 ENCOUNTER — Encounter: Payer: Self-pay | Admitting: Internal Medicine

## 2017-06-30 ENCOUNTER — Ambulatory Visit
Admission: RE | Admit: 2017-06-30 | Discharge: 2017-06-30 | Disposition: A | Discharge status: Home / Self Care | Payer: BC Managed Care – PPO | Source: Ambulatory Visit | Attending: Internal Medicine | Admitting: Internal Medicine

## 2017-06-30 DIAGNOSIS — E2839 Other primary ovarian failure: Secondary | ICD-10-CM

## 2017-06-30 DIAGNOSIS — Z1231 Encounter for screening mammogram for malignant neoplasm of breast: Secondary | ICD-10-CM

## 2017-07-02 ENCOUNTER — Other Ambulatory Visit: Payer: Self-pay | Admitting: Family Medicine

## 2017-08-02 ENCOUNTER — Other Ambulatory Visit: Payer: Self-pay

## 2017-08-02 MED ORDER — ESTRADIOL 0.05 MG/24HR TD PTWK: 0.0500 mg | MEDICATED_PATCH | TRANSDERMAL | 0 refills | Status: DC

## 2017-09-08 ENCOUNTER — Other Ambulatory Visit: Payer: Self-pay

## 2017-09-08 MED ORDER — ESTRADIOL 0.05 MG/24HR TD PTWK: 0.0500 mg | MEDICATED_PATCH | TRANSDERMAL | 0 refills | Status: DC

## 2017-09-08 NOTE — Telephone Encounter (Signed)
Pharmacy requests refill on patient's Estradiol.  Last filled #4 patches on 08/02/17.   Last OV/ and general exam 08/31/16.  Patient is due for annual check up with Nicki Reaperegina Baity, NP.  Will refill # 4 patches only and alert pharmacy to inform patient that she needs visit before more refills.  Thanks.

## 2017-10-09 ENCOUNTER — Other Ambulatory Visit: Payer: Self-pay | Admitting: Internal Medicine

## 2017-11-20 ENCOUNTER — Other Ambulatory Visit: Payer: Self-pay | Admitting: Internal Medicine

## 2018-02-15 ENCOUNTER — Other Ambulatory Visit: Payer: Self-pay | Admitting: Internal Medicine

## 2018-02-18 ENCOUNTER — Telehealth: Payer: Self-pay | Admitting: Internal Medicine

## 2018-02-18 MED ORDER — ESTRADIOL 0.05 MG/24HR TD PTWK: 0.0500 mg | MEDICATED_PATCH | TRANSDERMAL | 0 refills | Status: DC

## 2018-02-18 NOTE — Telephone Encounter (Signed)
Copied from CRM [id number]. Topic: Quick Communication - Rx Refill/Question >> Feb 18, 2018 10:17 AM Herby Abraham C wrote: Medication: estradiol (CLIMARA - DOSED IN MG/24 HR) 0.05 mg/24hr patch ---pt scheduled CPE for 03/31/18   Has the patient contacted their pharmacy? no  (Agent: If no, request that the patient contact the pharmacy for the refill.) (Agent: If yes, when and what did the pharmacy advise?)  Preferred Pharmacy (with phone number or street name): [id number] [place], [place] - [address] [phone] (Phone) [phone] (Fax)    Agent: Please be advised that RX refills may take up to 3 business days. We ask that you follow-up with your pharmacy.

## 2018-02-18 NOTE — Telephone Encounter (Signed)
Rx sent through e-scribe  

## 2018-02-18 NOTE — Addendum Note (Signed)
Addended by: Roena Malady on: 02/18/2018 01:24 PM   Modules accepted: Orders

## 2018-03-30 ENCOUNTER — Telehealth: Payer: Self-pay | Admitting: Internal Medicine

## 2018-03-30 NOTE — Telephone Encounter (Signed)
Called to confirm 11/14 appt.

## 2018-03-31 ENCOUNTER — Encounter

## 2018-03-31 ENCOUNTER — Ambulatory Visit: Payer: BC Managed Care – PPO | Admitting: Internal Medicine

## 2018-03-31 ENCOUNTER — Encounter: Payer: Self-pay | Admitting: Internal Medicine

## 2018-03-31 VITALS — BP 104/66 | HR 72 | Temp 97.9°F | Ht 65.0 in | Wt 173.0 lb

## 2018-03-31 DIAGNOSIS — G43809 Other migraine, not intractable, without status migrainosus: Secondary | ICD-10-CM

## 2018-03-31 DIAGNOSIS — Z0000 Encounter for general adult medical examination without abnormal findings: Secondary | ICD-10-CM | POA: Diagnosis not present

## 2018-03-31 DIAGNOSIS — R232 Flushing: Secondary | ICD-10-CM | POA: Diagnosis not present

## 2018-03-31 LAB — LIPID PANEL
CHOL/HDL RATIO: 3
Cholesterol: 145 mg/dL (ref 0–200)
HDL: 49.4 mg/dL (ref >39.00)
LDL CALC: 78 mg/dL (ref 0–99)
NONHDL: 95.97
Triglycerides: 92 mg/dL (ref 0.0–149.0)
VLDL: 18.4 mg/dL (ref 0.0–40.0)

## 2018-03-31 LAB — COMPREHENSIVE METABOLIC PANEL
ALK PHOS: 56 U/L (ref 39–117)
ALT: 15 U/L (ref 0–35)
AST: 21 U/L (ref 0–37)
Albumin: 4.3 g/dL (ref 3.5–5.2)
BUN: 19 mg/dL (ref 6–23)
CHLORIDE: 105 meq/L (ref 96–112)
CO2: 29 meq/L (ref 19–32)
Calcium: 9.6 mg/dL (ref 8.4–10.5)
Creatinine, Ser: 0.86 mg/dL (ref 0.40–1.20)
GFR: 72.06 mL/min (ref >60.00)
GLUCOSE: 87 mg/dL (ref 70–99)
Potassium: 4.3 mEq/L (ref 3.5–5.1)
SODIUM: 139 meq/L (ref 135–145)
TOTAL PROTEIN: 7.4 g/dL (ref 6.0–8.3)
Total Bilirubin: 0.4 mg/dL (ref 0.2–1.2)

## 2018-03-31 LAB — CBC
HCT: 40.4 % (ref 36.0–46.0)
Hemoglobin: 13.7 g/dL (ref 12.0–15.0)
MCHC: 33.9 g/dL (ref 30.0–36.0)
MCV: 90.5 fl (ref 78.0–100.0)
Platelets: 322 10*3/uL (ref 150.0–400.0)
RBC: 4.46 Mil/uL (ref 3.87–5.11)
RDW: 13.6 % (ref 11.5–15.5)
WBC: 7 10*3/uL (ref 4.0–10.5)

## 2018-03-31 LAB — VITAMIN D 25 HYDROXY (VIT D DEFICIENCY, FRACTURES): VITD: 35.92 ng/mL (ref 30.00–100.00)

## 2018-03-31 MED ORDER — ESTRADIOL 0.05 MG/24HR TD PTWK: 0.0500 mg | MEDICATED_PATCH | TRANSDERMAL | 3 refills | Status: DC

## 2018-03-31 NOTE — Patient Instructions (Signed)
Health Maintenance for Postmenopausal Women Menopause is a normal process in which your reproductive ability comes to an end. This process happens gradually over a span of months to years, usually between the ages of 22 and 9. Menopause is complete when you have missed 12 consecutive menstrual periods. It is important to talk with your health care provider about some of the most common conditions that affect postmenopausal women, such as heart disease, cancer, and bone loss (osteoporosis). Adopting a healthy lifestyle and getting preventive care can help to promote your health and wellness. Those actions can also lower your chances of developing some of these common conditions. What should I know about menopause? During menopause, you may experience a number of symptoms, such as:  Moderate-to-severe hot flashes.  Night sweats.  Decrease in sex drive.  Mood swings.  Headaches.  Tiredness.  Irritability.  Memory problems.  Insomnia.  Choosing to treat or not to treat menopausal changes is an individual decision that you make with your health care provider. What should I know about hormone replacement therapy and supplements? Hormone therapy products are effective for treating symptoms that are associated with menopause, such as hot flashes and night sweats. Hormone replacement carries certain risks, especially as you become older. If you are thinking about using estrogen or estrogen with progestin treatments, discuss the benefits and risks with your health care provider. What should I know about heart disease and stroke? Heart disease, heart attack, and stroke become more likely as you age. This may be due, in part, to the hormonal changes that your body experiences during menopause. These can affect how your body processes dietary fats, triglycerides, and cholesterol. Heart attack and stroke are both medical emergencies. There are many things that you can do to help prevent heart disease  and stroke:  Have your blood pressure checked at least every 1-2 years. High blood pressure causes heart disease and increases the risk of stroke.  If you are 53-22 years old, ask your health care provider if you should take aspirin to prevent a heart attack or a stroke.  Do not use any tobacco products, including cigarettes, chewing tobacco, or electronic cigarettes. If you need help quitting, ask your health care provider.  It is important to eat a healthy diet and maintain a healthy weight. ? Be sure to include plenty of vegetables, fruits, low-fat dairy products, and lean protein. ? Avoid eating foods that are high in solid fats, added sugars, or salt (sodium).  Get regular exercise. This is one of the most important things that you can do for your health. ? Try to exercise for at least 150 minutes each week. The type of exercise that you do should increase your heart rate and make you sweat. This is known as moderate-intensity exercise. ? Try to do strengthening exercises at least twice each week. Do these in addition to the moderate-intensity exercise.  Know your numbers.Ask your health care provider to check your cholesterol and your blood glucose. Continue to have your blood tested as directed by your health care provider.  What should I know about cancer screening? There are several types of cancer. Take the following steps to reduce your risk and to catch any cancer development as early as possible. Breast Cancer  Practice breast self-awareness. ? This means understanding how your breasts normally appear and feel. ? It also means doing regular breast self-exams. Let your health care provider know about any changes, no matter how small.  If you are 40  or older, have a clinician do a breast exam (clinical breast exam or CBE) every year. Depending on your age, family history, and medical history, it may be recommended that you also have a yearly breast X-ray (mammogram).  If you  have a family history of breast cancer, talk with your health care provider about genetic screening.  If you are at high risk for breast cancer, talk with your health care provider about having an MRI and a mammogram every year.  Breast cancer (BRCA) gene test is recommended for women who have family members with BRCA-related cancers. Results of the assessment will determine the need for genetic counseling and BRCA1 and for BRCA2 testing. BRCA-related cancers include these types: ? Breast. This occurs in males or females. ? Ovarian. ? Tubal. This may also be called fallopian tube cancer. ? Cancer of the abdominal or pelvic lining (peritoneal cancer). ? Prostate. ? Pancreatic.  Cervical, Uterine, and Ovarian Cancer Your health care provider may recommend that you be screened regularly for cancer of the pelvic organs. These include your ovaries, uterus, and vagina. This screening involves a pelvic exam, which includes checking for microscopic changes to the surface of your cervix (Pap test).  For women ages 21-65, health care providers may recommend a pelvic exam and a Pap test every three years. For women ages 79-65, they may recommend the Pap test and pelvic exam, combined with testing for human papilloma virus (HPV), every five years. Some types of HPV increase your risk of cervical cancer. Testing for HPV may also be done on women of any age who have unclear Pap test results.  Other health care providers may not recommend any screening for nonpregnant women who are considered low risk for pelvic cancer and have no symptoms. Ask your health care provider if a screening pelvic exam is right for you.  If you have had past treatment for cervical cancer or a condition that could lead to cancer, you need Pap tests and screening for cancer for at least 20 years after your treatment. If Pap tests have been discontinued for you, your risk factors (such as having a new sexual partner) need to be  reassessed to determine if you should start having screenings again. Some women have medical problems that increase the chance of getting cervical cancer. In these cases, your health care provider may recommend that you have screening and Pap tests more often.  If you have a family history of uterine cancer or ovarian cancer, talk with your health care provider about genetic screening.  If you have vaginal bleeding after reaching menopause, tell your health care provider.  There are currently no reliable tests available to screen for ovarian cancer.  Lung Cancer Lung cancer screening is recommended for adults 69-62 years old who are at high risk for lung cancer because of a history of smoking. A yearly low-dose CT scan of the lungs is recommended if you:  Currently smoke.  Have a history of at least 30 pack-years of smoking and you currently smoke or have quit within the past 15 years. A pack-year is smoking an average of one pack of cigarettes per day for one year.  Yearly screening should:  Continue until it has been 15 years since you quit.  Stop if you develop a health problem that would prevent you from having lung cancer treatment.  Colorectal Cancer  This type of cancer can be detected and can often be prevented.  Routine colorectal cancer screening usually begins at  age 42 and continues through age 45.  If you have risk factors for colon cancer, your health care provider may recommend that you be screened at an earlier age.  If you have a family history of colorectal cancer, talk with your health care provider about genetic screening.  Your health care provider may also recommend using home test kits to check for hidden blood in your stool.  A small camera at the end of a tube can be used to examine your colon directly (sigmoidoscopy or colonoscopy). This is done to check for the earliest forms of colorectal cancer.  Direct examination of the colon should be repeated every  5-10 years until age 71. However, if early forms of precancerous polyps or small growths are found or if you have a family history or genetic risk for colorectal cancer, you may need to be screened more often.  Skin Cancer  Check your skin from head to toe regularly.  Monitor any moles. Be sure to tell your health care provider: ? About any new moles or changes in moles, especially if there is a change in a mole's shape or color. ? If you have a mole that is larger than the size of a pencil eraser.  If any of your family members has a history of skin cancer, especially at a young age, talk with your health care provider about genetic screening.  Always use sunscreen. Apply sunscreen liberally and repeatedly throughout the day.  Whenever you are outside, protect yourself by wearing long sleeves, pants, a wide-brimmed hat, and sunglasses.  What should I know about osteoporosis? Osteoporosis is a condition in which bone destruction happens more quickly than new bone creation. After menopause, you may be at an increased risk for osteoporosis. To help prevent osteoporosis or the bone fractures that can happen because of osteoporosis, the following is recommended:  If you are 46-71 years old, get at least 1,000 mg of calcium and at least 600 mg of vitamin D per day.  If you are older than age 55 but younger than age 65, get at least 1,200 mg of calcium and at least 600 mg of vitamin D per day.  If you are older than age 54, get at least 1,200 mg of calcium and at least 800 mg of vitamin D per day.  Smoking and excessive alcohol intake increase the risk of osteoporosis. Eat foods that are rich in calcium and vitamin D, and do weight-bearing exercises several times each week as directed by your health care provider. What should I know about how menopause affects my mental health? Depression may occur at any age, but it is more common as you become older. Common symptoms of depression  include:  Low or sad mood.  Changes in sleep patterns.  Changes in appetite or eating patterns.  Feeling an overall lack of motivation or enjoyment of activities that you previously enjoyed.  Frequent crying spells.  Talk with your health care provider if you think that you are experiencing depression. What should I know about immunizations? It is important that you get and maintain your immunizations. These include:  Tetanus, diphtheria, and pertussis (Tdap) booster vaccine.  Influenza every year before the flu season begins.  Pneumonia vaccine.  Shingles vaccine.  Your health care provider may also recommend other immunizations. This information is not intended to replace advice given to you by your health care provider. Make sure you discuss any questions you have with your health care provider. Document Released: 06/26/2005  Document Revised: 11/22/2015 Document Reviewed: 02/05/2015 Elsevier Interactive Patient Education  2018 [employer]  

## 2018-03-31 NOTE — Assessment & Plan Note (Signed)
Having breakthrough if she forgets her patch Will not wean at this time

## 2018-03-31 NOTE — Assessment & Plan Note (Signed)
Currently not an issue Will monitor 

## 2018-03-31 NOTE — Progress Notes (Signed)
Subjective:    Patient ID: Kathleen Santos, female    DOB: 15-Jun-1960, 57 y.o.   MRN: [id number]  HPI  Pt presents to the clinic today for her annual exam.   Migraines: Currently not an issue.  Hot Flashes: Controlled with Estradiol. She reports if she misses a dose, she will have breakthrough symptoms.  Flu: 03/2018 Tetanus: 05/2014 Pap Smear: 2015, total hysterectomy Mammogram: 06/2017 Colon Screening: 12/2016 Vision Screening: annually Dentist: biannually  Diet: She does eat meat. She consumes fruits and veggies daily. She tries to avoid fried foods. She drinks mostly coffee, water, sweet tea occassionally Exercise: Walking at lunch daily for 25 minutes  Review of Systems      Past Medical History:  Diagnosis Date  . Allergy   . Asthma    Childhood  . Migraine     Current Outpatient Medications  Medication Sig Dispense Refill  . estradiol (CLIMARA - DOSED IN MG/24 HR) 0.05 mg/24hr patch Place 1 patch (0.05 mg total) onto the skin once a week. 12 patch 3  . Multiple Vitamin (MULTIVITAMIN) tablet Take 2 tablets by mouth daily.      No current facility-administered medications for this visit.     No Known Allergies  Family History  Problem Relation Age of Onset  . Stroke Mother [id number]  . Osteoarthritis Mother   . Diabetes Mother   . Heart disease Mother   . Arthritis Mother   . COPD Father   . Throat cancer Father   . Cancer Father   . Heart disease Father   . Cancer Sister        Breast  . Breast cancer Sister        Early 550's  . Cancer Brother        Prostate  . Cancer Sister        Breast  . Breast cancer Sister        Early 1950's    Social History   Socioeconomic History  . Marital status: Married    Spouse name: Not on file  . Number of children: Not on file  . Years of education: Not on file  . Highest education level: Not on file  Occupational History  . Not on file  Social Needs  . Financial resource strain: Not on file  . Food  insecurity:    Worry: Not on file    Inability: Not on file  . Transportation needs:    Medical: Not on file    Non-medical: Not on file  Tobacco Use  . Smoking status: Never Smoker  . Smokeless tobacco: Never Used  Substance and Sexual Activity  . Alcohol use: No    Alcohol/week: 0.0 standard drinks  . Drug use: No  . Sexual activity: Yes    Partners: Male    Birth control/protection: Surgical    Comment: Husband  Lifestyle  . Physical activity:    Days per week: Not on file    Minutes per session: Not on file  . Stress: Not on file  Relationships  . Social connections:    Talks on phone: Not on file    Gets together: Not on file    Attends religious service: Not on file    Active member of club or organization: Not on file    Attends meetings of clubs or organizations: Not on file    Relationship status: Not on file  . Intimate partner violence:    Fear of current or  ex partner: Not on file    Emotionally abused: Not on file    Physically abused: Not on file    Forced sexual activity: Not on file  Other Topics Concern  . Not on file  Social History Narrative   Married   1 year of college   [employer] healthcare- Surgery scheduler    No Children    Caffeine- coffee 2 cups, tea prn   Counseling- 17 years ago after mother passing      Constitutional: Denies fever, malaise, fatigue, headache or abrupt weight changes.  HEENT: Denies eye pain, eye redness, ear pain, ringing in the ears, wax buildup, runny nose, nasal congestion, bloody nose, or sore throat. Respiratory: Denies difficulty breathing, shortness of breath, cough or sputum production.   Cardiovascular: Denies chest pain, chest tightness, palpitations or swelling in the hands or feet.  Gastrointestinal: Pt reports intermittent reflux. Denies abdominal pain, bloating, constipation, diarrhea or blood in the stool.  GU: Denies urgency, frequency, pain with urination, burning sensation, blood in urine, odor or  discharge. Musculoskeletal: Denies decrease in range of motion, difficulty with gait, muscle pain or joint pain and swelling.  Skin: Denies redness, rashes, lesions or ulcercations.  Neurological: Denies dizziness, difficulty with memory, difficulty with speech or problems with balance and coordination.  Psych: Denies anxiety, depression, SI/HI.  No other specific complaints in a complete review of systems (except as listed in HPI above).  Objective:   Physical Exam    BP 104/66   Pulse 72   Temp 97.9 F (36.6 C) (Oral)   Ht 5\' 5"  (1.651 m)   Wt 173 lb (78.5 kg)   BMI 28.79 kg/m  Wt Readings from Last 3 Encounters:  03/31/18 173 lb (78.5 kg)  10/13/16 183 lb (83 kg)  08/31/16 183 lb 8 oz (83.2 kg)    General: Appears her stated age, obese in NAD. Skin: Warm, dry and intact.  HEENT: Head: normal shape and size; Eyes: sclera white, no icterus, conjunctiva pink, PERRLA and EOMs intact; Ears: Tm's gray and intact, normal light reflex; Throat/Mouth: Teeth present, mucosa pink and moist, no exudate, lesions or ulcerations noted.  Neck:  Neck supple, trachea midline. No masses, lumps or thyromegaly present.  Cardiovascular: Normal rate and rhythm. S1,S2 noted.  No murmur, rubs or gallops noted. No JVD or BLE edema. No carotid bruits noted. Pulmonary/Chest: Normal effort and positive vesicular breath sounds. No respiratory distress. No wheezes, rales or ronchi noted.  Abdomen: Soft and nontender. Normal bowel sounds. No distention or masses noted. Liver, spleen and kidneys non palpable. Musculoskeletal: Strength 5/5 BUE/BLE. No difficulty with gait.  Neurological: Alert and oriented. Cranial nerves II-XII grossly intact. Coordination normal.  Psychiatric: Mood and affect normal. Behavior is normal. Judgment and thought content normal.     BMET    Component Value Date/Time   NA 141 08/31/2016 0900   K 4.2 08/31/2016 0900   CL 107 08/31/2016 0900   CO2 28 08/31/2016 0900    GLUCOSE 90 08/31/2016 0900   BUN 17 08/31/2016 0900   CREATININE 0.93 08/31/2016 0900   CALCIUM 9.2 08/31/2016 0900    Lipid Panel     Component Value Date/Time   CHOL 139 08/31/2016 0900   TRIG 75.0 08/31/2016 0900   HDL 47.80 08/31/2016 0900   CHOLHDL 3 08/31/2016 0900   VLDL 15.0 08/31/2016 0900   LDLCALC 76 08/31/2016 0900    CBC    Component Value Date/Time   WBC 6.5 08/31/2016 0900  RBC 4.40 08/31/2016 0900   HGB 13.3 08/31/2016 0900   HCT 40.1 08/31/2016 0900   PLT 303.0 08/31/2016 0900   MCV 91.2 08/31/2016 0900   MCHC 33.1 08/31/2016 0900   RDW 13.7 08/31/2016 0900    Hgb A1C No results found for: HGBA1C        Assessment & Plan:   Preventative Health Maintenance:  Flu and tetanus UTD Pap smear and mammogram UTD Colon screening UTD Encouraged her to consume a balanced diet and exercise regimen Advised her to see an eye doctor and dentist annually Will check CBC, CMET, Lipid and Vit D today  RTC in 1 year, sooner if needed Nicki Reaper, NP

## 2018-04-03 ENCOUNTER — Encounter: Payer: Self-pay | Admitting: Internal Medicine

## 2018-04-29 ENCOUNTER — Other Ambulatory Visit: Payer: Self-pay | Admitting: Internal Medicine

## 2018-04-29 DIAGNOSIS — Z1231 Encounter for screening mammogram for malignant neoplasm of breast: Secondary | ICD-10-CM

## 2018-06-01 ENCOUNTER — Ambulatory Visit: Payer: BC Managed Care – PPO | Admitting: Family Medicine

## 2018-06-01 ENCOUNTER — Ambulatory Visit: Payer: BC Managed Care – PPO | Admitting: Internal Medicine

## 2018-06-01 ENCOUNTER — Encounter: Payer: Self-pay | Admitting: Family Medicine

## 2018-06-01 VITALS — BP 90/62 | HR 75 | Temp 98.1°F | Resp 10 | Ht 65.0 in | Wt 178.0 lb

## 2018-06-01 DIAGNOSIS — B9789 Other viral agents as the cause of diseases classified elsewhere: Secondary | ICD-10-CM

## 2018-06-01 DIAGNOSIS — J069 Acute upper respiratory infection, unspecified: Secondary | ICD-10-CM | POA: Diagnosis not present

## 2018-06-01 MED ORDER — BENZONATATE 100 MG PO CAPS: 100.0000 mg | ORAL_CAPSULE | Freq: Two times a day (BID) | ORAL | 0 refills | Status: DC | PRN

## 2018-06-01 NOTE — Progress Notes (Signed)
Subjective:     Kathleen Santos is a 58 y.o. female presenting for Cough (Started to not feel well on Friday 05/27/2018, on 05/28/2018 had body aches, chills, runny nose. Fever on 05/30/2018 100.7, 99.6 yesterday-05/31/2018. Coughing a lot. Has taking Tylenol, Ibuprofen, Vics rub.)     Cough  This is a new problem. The current episode started in the past 7 days. The problem has been gradually improving. The problem occurs every few minutes. The cough is non-productive. Associated symptoms include chills, a fever, myalgias, rhinorrhea, a sore throat and shortness of breath. Pertinent negatives include no chest pain, ear congestion, ear pain, hemoptysis, nasal congestion, postnasal drip or wheezing. Treatments tried: tylenol, ibuprofen, vics vapor rub.     Review of Systems  Constitutional: Positive for chills and fever.  HENT: Positive for rhinorrhea and sore throat. Negative for ear pain, postnasal drip and trouble swallowing.   Respiratory: Positive for cough, chest tightness and shortness of breath. Negative for hemoptysis and wheezing.   Cardiovascular: Negative for chest pain.  Gastrointestinal: Negative for diarrhea, nausea and vomiting.  Musculoskeletal: Positive for myalgias.     Social History   Tobacco Use  Smoking Status Never Smoker  Smokeless Tobacco Never Used        Objective:    BP Readings from Last 3 Encounters:  06/01/18 90/62  03/31/18 104/66  10/13/16 100/66   Wt Readings from Last 3 Encounters:  06/01/18 178 lb (80.7 kg)  03/31/18 173 lb (78.5 kg)  10/13/16 183 lb (83 kg)    BP 90/62   Pulse 75   Temp 98.1 F (36.7 C)   Resp 10   Ht 5\' 5"  (1.651 m)   Wt 178 lb (80.7 kg)   SpO2 96%   BMI 29.62 kg/m    Physical Exam Constitutional:      General: She is not in acute distress.    Appearance: She is well-developed. She is not diaphoretic.  HENT:     Head: Normocephalic and atraumatic.     Right Ear: Tympanic membrane and ear canal  normal.     Left Ear: Tympanic membrane and ear canal normal.     Nose: Mucosal edema and rhinorrhea present.     Right Sinus: No maxillary sinus tenderness or frontal sinus tenderness.     Left Sinus: No maxillary sinus tenderness or frontal sinus tenderness.     Mouth/Throat:     Pharynx: Uvula midline. Posterior oropharyngeal erythema present. No oropharyngeal exudate.     Tonsils: Swelling: 0 on the right. 0 on the left.  Eyes:     General: No scleral icterus.    Conjunctiva/sclera: Conjunctivae normal.  Neck:     Musculoskeletal: Neck supple.  Cardiovascular:     Rate and Rhythm: Normal rate and regular rhythm.     Heart sounds: Normal heart sounds. No murmur.  Pulmonary:     Effort: Pulmonary effort is normal. No respiratory distress.     Breath sounds: Normal breath sounds.  Lymphadenopathy:     Cervical: No cervical adenopathy.  Skin:    General: Skin is warm and dry.     Capillary Refill: Capillary refill takes less than 2 seconds.  Neurological:     Mental Status: She is alert.           Assessment & Plan:   Problem List Items Addressed This Visit    None    Visit Diagnoses    Viral URI with cough    -  Primary   Relevant Medications   benzonatate (TESSALON) 100 MG capsule     Symptomatic care  Return if symptoms worsen or fail to improve.  Lynnda Child, MD

## 2018-06-01 NOTE — Patient Instructions (Signed)

## 2018-07-04 ENCOUNTER — Ambulatory Visit
Admission: RE | Admit: 2018-07-04 | Discharge: 2018-07-04 | Disposition: A | Discharge status: Home / Self Care | Payer: BC Managed Care – PPO | Source: Ambulatory Visit | Attending: Internal Medicine | Admitting: Internal Medicine

## 2018-07-04 DIAGNOSIS — Z1231 Encounter for screening mammogram for malignant neoplasm of breast: Secondary | ICD-10-CM

## 2018-10-25 ENCOUNTER — Encounter: Payer: Self-pay | Admitting: Internal Medicine

## 2018-10-31 ENCOUNTER — Encounter: Payer: Self-pay | Admitting: Internal Medicine

## 2018-10-31 ENCOUNTER — Ambulatory Visit (INDEPENDENT_AMBULATORY_CARE_PROVIDER_SITE_OTHER): Payer: BC Managed Care – PPO | Admitting: Internal Medicine

## 2018-10-31 ENCOUNTER — Other Ambulatory Visit: Payer: Self-pay

## 2018-10-31 VITALS — BP 106/64 | HR 60 | Temp 98.1°F | Wt 170.0 lb

## 2018-10-31 DIAGNOSIS — M79661 Pain in right lower leg: Secondary | ICD-10-CM | POA: Diagnosis not present

## 2018-10-31 DIAGNOSIS — M79662 Pain in left lower leg: Secondary | ICD-10-CM | POA: Diagnosis not present

## 2018-10-31 MED ORDER — PREDNISONE 10 MG PO TABS: ORAL_TABLET | ORAL | 0 refills | Status: DC

## 2018-10-31 NOTE — Patient Instructions (Signed)
Shin Splints Rehab  Ask your health care provider which exercises are safe for you. Do exercises exactly as told by your health care provider and adjust them as directed. It is normal to feel mild stretching, pulling, tightness, or discomfort as you do these exercises, but you should stop right away if you feel sudden pain or your pain gets worse. Do not begin these exercises until told by your health care provider.  Stretching and range of motion exercise  This exercise warms up your muscles and joints and improves the movement and flexibility of your lower leg. This exercise also helps to relieve pain, numbness, and tingling.  Exercise A: Calf stretch, standing    1. Stand with the ball of your left / right foot on a step. The ball of your foot is on the walking surface, right under your toes.  2. Keep your other foot firmly on the same step.  3. Hold onto the wall, a railing, or a chair for balance.  4. Slowly lift your other foot, allowing your body weight to press your left / right heel down over the edge of the step. You should feel a stretch in your left / right calf.  5. Hold this position for __________ seconds.  6. Repeat this exercise with a slight bend in your left / right knee.  Repeat __________ times with your left / right knee straight and __________ times with your left / right knee bent. Complete this exercise __________ times a day.  Strengthening exercises  These exercises build strength and endurance in your lower leg. Endurance is the ability to use your muscles for a long time, even after they get tired.  Exercise B: Dorsiflexion    1. Secure a rubber exercise band or tubing to a fixed object, such as a table leg or a pole.  2. Secure the other end of the band around your left / right foot.  3. Sit on the floor, facing the fixed object. The band should be slightly tense when your foot is relaxed.  4. Slowly use your ankle muscles to pull your foot toward you.  5. Hold this position for  __________ seconds.  6. Slowly release the tension in the band and return your foot to the starting position.  Repeat __________ times. Complete this exercise __________ times a day.  Exercise C: Ankle eversion with band  1. Secure one end of a rubber exercise band or tubing to a fixed object, such as a table leg or a pole, that will stay in place when the band is pulled.  2. Loop the other end of the band around the middle of your left / right foot.  3. Sit on the floor, facing the fixed object. The band should be slightly tense when your foot is relaxed.  4. Make fists with your hands and put them between your knees. This will focus your strengthening at your ankle.  5. Leading with your little toe, slowly push your banded foot outward, away from your other leg. Make sure the band is positioned to resist the entire motion.  6. Hold this position for __________ seconds.  7. Control the tension in the band as you slowly return your foot to the starting position.  Repeat __________ times. Complete this exercise __________ times a day.  Exercise D: Ankle inversion with band  1. Secure one end of a rubber exercise band or tubing to a fixed object, such as a table leg or a pole, that   will stay still when the band is pulled.  2. Loop the other end of the band around your left / right foot, just below your toes.  3. Sit on the floor, facing the fixed object. The band should be slightly tense when your foot is relaxed.  4. Make fists with your hands and put them between your knees. This will focus your strengthening at your ankle.  5. Leading with your big toe, slowly pull your banded foot inward, toward your other leg. Make sure the band is positioned to resist the entire motion.  6. Hold this position for __________ seconds.  7. Control the tension in the band as you slowly return your foot to the starting position.  Repeat __________ times. Complete this exercise __________ times a day.  Exercise E: Lateral walking with  band  1. Stand in a long hallway.  2. Wrap a loop of exercise band around your legs, just above your knees.  3. Bend your knees gently and drop your hips down and back so your weight is over your heels.  4. Step to the side to move down the length of the hallway, keeping your toes pointed forward and keeping tension in the band.  5. Repeat, leading with your other leg.  Repeat __________ times. Complete this exercise __________ times a day.  Balance exercise  This exercise will help improve your control of your foot and ankle when you are standing or walking.  Exercise F: Single leg stand  1. Without wearing shoes, stand near a railing or in a doorway. You may hold onto the railing or door frame as needed.  2. Stand on your left / right foot. Keep your big toe down on the floor and try to keep your arch lifted.  3. If this exercise is too easy, you can try doing it with your eyes closed or while standing on a pillow.  4. Hold this position for __________ seconds.  Repeat __________ times. Complete this exercise __________ times a day.  This information is not intended to replace advice given to you by your health care provider. Make sure you discuss any questions you have with your health care provider.  Document Released: 05/04/2005 Document Revised: 01/07/2016 Document Reviewed: 01/31/2015  Elsevier Interactive Patient Education © 2019 [employer]

## 2018-10-31 NOTE — Progress Notes (Signed)
Subjective:    Patient ID: Kathleen Santos, female    DOB: 04-23-1961, 58 y.o.   MRN: [id number]  HPI  Pt presents to the clinic today with c/o shin pain. She reports this started 2-3 days ago. She describes the pain as achy and sharp. The pain intermittently radiates into her left thigh and hip. The left seems worse than the right. She reports the pain seems worse at night. She denies low back pain. She does report associated tingling but no numbness or weakness. She is not doing any walking or running consistently. She denies any injury to the area. She has taken Ibuprofen with some relief.  Review of Systems      Past Medical History:  Diagnosis Date  . Allergy   . Asthma    Childhood  . Migraine     Current Outpatient Medications  Medication Sig Dispense Refill  . benzonatate (TESSALON) 100 MG capsule Take 1 capsule (100 mg total) by mouth 2 (two) times daily as needed for cough. 20 capsule 0  . estradiol (CLIMARA - DOSED IN MG/24 HR) 0.05 mg/24hr patch Place 1 patch (0.05 mg total) onto the skin once a week. 12 patch 3  . Multiple Vitamin (MULTIVITAMIN) tablet Take 2 tablets by mouth daily.      No current facility-administered medications for this visit.     No Known Allergies  Family History  Problem Relation Age of Onset  . Stroke Mother [id number]  . Osteoarthritis Mother   . Diabetes Mother   . Heart disease Mother   . Arthritis Mother   . COPD Father   . Throat cancer Father   . Cancer Father   . Heart disease Father   . Cancer Sister        Breast  . Breast cancer Sister        Early 4550's  . Cancer Brother        Prostate  . Cancer Sister        Breast  . Breast cancer Sister        Early 10350's    Social History   Socioeconomic History  . Marital status: Married    Spouse name: Not on file  . Number of children: Not on file  . Years of education: Not on file  . Highest education level: Not on file  Occupational History  . Not on file  Social  Needs  . Financial resource strain: Not on file  . Food insecurity    Worry: Not on file    Inability: Not on file  . Transportation needs    Medical: Not on file    Non-medical: Not on file  Tobacco Use  . Smoking status: Never Smoker  . Smokeless tobacco: Never Used  Substance and Sexual Activity  . Alcohol use: No    Alcohol/week: 0.0 standard drinks  . Drug use: No  . Sexual activity: Yes    Partners: Male    Birth control/protection: Surgical    Comment: Husband  Lifestyle  . Physical activity    Days per week: Not on file    Minutes per session: Not on file  . Stress: Not on file  Relationships  . Social Musicianconnections    Talks on phone: Not on file    Gets together: Not on file    Attends religious service: Not on file    Active member of club or organization: Not on file    Attends meetings of clubs  or organizations: Not on file    Relationship status: Not on file  . Intimate partner violence    Fear of current or ex partner: Not on file    Emotionally abused: Not on file    Physically abused: Not on file    Forced sexual activity: Not on file  Other Topics Concern  . 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  Respiratory: Denies difficulty breathing, shortness of breath, cough or sputum production.   Cardiovascular: Denies chest pain, chest tightness, palpitations or swelling in the hands or feet.  Musculoskeletal: Pt reports bilateral shin pain. Denies decrease in range of motion, difficulty with gait, muscle pain or joint pain and swelling.  Skin: Denies redness, rashes, lesions or ulcercations.  Neurological: Pt reports tingling in lower extremities. Denies numbness or problems with balance and coordination.     No other specific complaints in a complete review of systems (except as listed in HPI above).  Objective:   Physical Exam    BP 106/64   Pulse 60   Temp 98.1 F (36.7 C) (Oral)   Wt 170 lb (77.1 kg)   SpO2 98%   BMI 28.29 kg/m  Wt Readings from Last 3 Encounters:  10/31/18 170 lb (77.1 kg)  06/01/18 178 lb (80.7 kg)  03/31/18 173 lb (78.5 kg)    General: Appears her stated age, well developed, well nourished in NAD. Skin: Warm, dry and intact. No rashes noted. Cardiovascular: Normal rate and rhythm. S1,S2 noted.  No murmur, rubs or gallops noted.  Pulmonary/Chest: Normal effort and positive vesicular breath sounds. No respiratory distress. No wheezes, rales or ronchi noted.  Musculoskeletal: Normal flexion, extension and rotation of the spine. Normal abduction, adduction, internal and external rotation of the left him. Normal flexion and extension of the left knee. Pain with palpation of the left trochanter. No pain with palpation of either tibias. She is able to walk on tiptoes and heels without pain. No signs of joint swelling. Strength 5/5 BLE. No difficulty with gait.   BMET    Component Value Date/Time   NA 139 03/31/2018 0938   K 4.3 03/31/2018 0938   CL 105 03/31/2018 0938   CO2 29 03/31/2018 0938   GLUCOSE 87 03/31/2018 0938   BUN 19 03/31/2018 0938   CREATININE 0.86 03/31/2018 0938   CALCIUM 9.6 03/31/2018 0938    Lipid Panel     Component Value Date/Time   CHOL 145 03/31/2018 0938   TRIG 92.0 03/31/2018 0938   HDL 49.40 03/31/2018 0938   CHOLHDL 3 03/31/2018 0938   VLDL 18.4 03/31/2018 0938   LDLCALC 78 03/31/2018 0938    CBC    Component Value Date/Time   WBC 7.0 03/31/2018 0938   RBC 4.46 03/31/2018 0938   HGB 13.7 03/31/2018 0938   HCT 40.4 03/31/2018 0938   PLT 322.0 03/31/2018 0938   MCV 90.5 03/31/2018 0938   MCHC 33.9 03/31/2018 0938   RDW 13.6 03/31/2018 0938    Hgb A1C No results found for: HGBA1C        Assessment &  Plan:   Bilateral Shin Pain:  RX for Pred Taper x 6 days- avoid OTC NSAID's  Try heat at night If no improvement, consider xray of bilateral tib/fibs Encouraged daily stretching  Return precautions discussed Webb Silversmith, NP

## 2019-01-02 ENCOUNTER — Ambulatory Visit: Payer: Self-pay

## 2019-01-02 ENCOUNTER — Encounter: Payer: Self-pay | Admitting: Rheumatology

## 2019-01-02 ENCOUNTER — Ambulatory Visit: Payer: BC Managed Care – PPO | Admitting: Rheumatology

## 2019-01-02 ENCOUNTER — Other Ambulatory Visit: Payer: Self-pay

## 2019-01-02 VITALS — BP 109/74 | HR 80 | Resp 13 | Ht 65.0 in | Wt 176.0 lb

## 2019-01-02 DIAGNOSIS — M25562 Pain in left knee: Secondary | ICD-10-CM

## 2019-01-02 DIAGNOSIS — M25561 Pain in right knee: Secondary | ICD-10-CM | POA: Diagnosis not present

## 2019-01-02 DIAGNOSIS — M7062 Trochanteric bursitis, left hip: Secondary | ICD-10-CM | POA: Diagnosis not present

## 2019-01-02 DIAGNOSIS — M19042 Primary osteoarthritis, left hand: Secondary | ICD-10-CM

## 2019-01-02 DIAGNOSIS — M19041 Primary osteoarthritis, right hand: Secondary | ICD-10-CM

## 2019-01-02 DIAGNOSIS — Z8669 Personal history of other diseases of the nervous system and sense organs: Secondary | ICD-10-CM

## 2019-01-02 DIAGNOSIS — G8929 Other chronic pain: Secondary | ICD-10-CM

## 2019-01-02 DIAGNOSIS — M25551 Pain in right hip: Secondary | ICD-10-CM

## 2019-01-02 DIAGNOSIS — M25552 Pain in left hip: Secondary | ICD-10-CM | POA: Diagnosis not present

## 2019-01-02 MED ORDER — LIDOCAINE HCL 1 % IJ SOLN
1.5000 mL | INTRAMUSCULAR | Status: AC | PRN
  Administered 2019-01-02: 1.5 mL

## 2019-01-02 MED ORDER — TRIAMCINOLONE ACETONIDE 40 MG/ML IJ SUSP
40.0000 mg | INTRAMUSCULAR | Status: AC | PRN
  Administered 2019-01-02: 40 mg via INTRA_ARTICULAR

## 2019-01-02 NOTE — Patient Instructions (Signed)
Journal for Nurse Practitioners, 15(4), 263-267. Retrieved February 21, 2018 from [email or web address].com/[email or web address]">  °Knee Exercises °Ask your health care provider which exercises are safe for you. Do exercises exactly as told by your health care provider and adjust them as directed. It is normal to feel mild stretching, pulling, tightness, or discomfort as you do these exercises. Stop right away if you feel sudden pain or your pain gets worse. Do not begin these exercises until told by your health care provider. °Stretching and range-of-motion exercises °These exercises warm up your muscles and joints and improve the movement and flexibility of your knee. These exercises also help to relieve pain and swelling. °Knee extension, prone °1. Lie on your abdomen (prone position) on a bed. °2. Place your left / right knee just beyond the edge of the surface so your knee is not on the bed. You can put a towel under your left / right thigh just above your kneecap for comfort. °3. Relax your leg muscles and allow gravity to straighten your knee (extension). You should feel a stretch behind your left / right knee. °4. Hold this position for __________ seconds. °5. Scoot up so your knee is supported between repetitions. °Repeat __________ times. Complete this exercise __________ times a day. °Knee flexion, active ° °1. Lie on your back with both legs straight. If this causes back discomfort, bend your left / right knee so your foot is flat on the floor. °2. Slowly slide your left / right heel back toward your buttocks. Stop when you feel a gentle stretch in the front of your knee or thigh (flexion). °3. Hold this position for __________ seconds. °4. Slowly slide your left / right heel back to the starting position. °Repeat __________ times. Complete this exercise __________ times a day. °Quadriceps stretch, prone ° °1. Lie on your abdomen on a firm surface, such as a bed or padded floor. °2. Bend your left / right knee and hold  your ankle. If you cannot reach your ankle or pant leg, loop a belt around your foot and grab the belt instead. °3. Gently pull your heel toward your buttocks. Your knee should not slide out to the side. You should feel a stretch in the front of your thigh and knee (quadriceps). °4. Hold this position for __________ seconds. °Repeat __________ times. Complete this exercise __________ times a day. °Hamstring, supine °1. Lie on your back (supine position). °2. Loop a belt or towel over the ball of your left / right foot. The ball of your foot is on the walking surface, right under your toes. °3. Straighten your left / right knee and slowly pull on the belt to raise your leg until you feel a gentle stretch behind your knee (hamstring). °? Do not let your knee bend while you do this. °? Keep your other leg flat on the floor. °4. Hold this position for __________ seconds. °Repeat __________ times. Complete this exercise __________ times a day. °Strengthening exercises °These exercises build strength and endurance in your knee. Endurance is the ability to use your muscles for a long time, even after they get tired. °Quadriceps, isometric °This exercise stretches the muscles in front of your thigh (quadriceps) without moving your knee joint (isometric). °1. Lie on your back with your left / right leg extended and your other knee bent. Put a rolled towel or small pillow under your knee if told by your health care provider. °2. Slowly tense the muscles in the front of your left /   right thigh. You should see your kneecap slide up toward your hip or see increased dimpling just above the knee. This motion will push the back of the knee toward the floor. °3. For __________ seconds, hold the muscle as tight as you can without increasing your pain. °4. Relax the muscles slowly and completely. °Repeat __________ times. Complete this exercise __________ times a day. °Straight leg raises °This exercise stretches the muscles in front  of your thigh (quadriceps) and the muscles that move your hips (hip flexors). °1. Lie on your back with your left / right leg extended and your other knee bent. °2. Tense the muscles in the front of your left / right thigh. You should see your kneecap slide up or see increased dimpling just above the knee. Your thigh may even shake a bit. °3. Keep these muscles tight as you raise your leg 4-6 inches (10-15 cm) off the floor. Do not let your knee bend. °4. Hold this position for __________ seconds. °5. Keep these muscles tense as you lower your leg. °6. Relax your muscles slowly and completely after each repetition. °Repeat __________ times. Complete this exercise __________ times a day. °Hamstring, isometric °1. Lie on your back on a firm surface. °2. Bend your left / right knee about __________ degrees. °3. Dig your left / right heel into the surface as if you are trying to pull it toward your buttocks. Tighten the muscles in the back of your thighs (hamstring) to "dig" as hard as you can without increasing any pain. °4. Hold this position for __________ seconds. °5. Release the tension gradually and allow your muscles to relax completely for __________ seconds after each repetition. °Repeat __________ times. Complete this exercise __________ times a day. °Hamstring curls °If told by your health care provider, do this exercise while wearing ankle weights. Begin with __________ lb weights. Then increase the weight by 1 lb (0.5 kg) increments. Do not wear ankle weights that are more than __________ lb. °1. Lie on your abdomen with your legs straight. °2. Bend your left / right knee as far as you can without feeling pain. Keep your hips flat against the floor. °3. Hold this position for __________ seconds. °4. Slowly lower your leg to the starting position. °Repeat __________ times. Complete this exercise __________ times a day. °Squats °This exercise strengthens the muscles in front of your thigh and knee  (quadriceps). °1. Stand in front of a table, with your feet and knees pointing straight ahead. You may rest your hands on the table for balance but not for support. °2. Slowly bend your knees and lower your hips like you are going to sit in a chair. °? Keep your weight over your heels, not over your toes. °? Keep your lower legs upright so they are parallel with the table legs. °? Do not let your hips go lower than your knees. °? Do not bend lower than told by your health care provider. °? If your knee pain increases, do not bend as low. °3. Hold the squat position for __________ seconds. °4. Slowly push with your legs to return to standing. Do not use your hands to pull yourself to standing. °Repeat __________ times. Complete this exercise __________ times a day. °Wall slides °This exercise strengthens the muscles in front of your thigh and knee (quadriceps). °1. Lean your back against a smooth wall or door, and walk your feet out 18-24 inches (46-61 cm) from it. °2. Place your feet hip-width apart. °3.   Slowly slide down the wall or door until your knees bend __________ degrees. Keep your knees over your heels, not over your toes. Keep your knees in line with your hips. °4. Hold this position for __________ seconds. °Repeat __________ times. Complete this exercise __________ times a day. °Straight leg raises °This exercise strengthens the muscles that rotate the leg at the hip and move it away from your body (hip abductors). °1. Lie on your side with your left / right leg in the top position. Lie so your head, shoulder, knee, and hip line up. You may bend your bottom knee to help you keep your balance. °2. Roll your hips slightly forward so your hips are stacked directly over each other and your left / right knee is facing forward. °3. Leading with your heel, lift your top leg 4-6 inches (10-15 cm). You should feel the muscles in your outer hip lifting. °? Do not let your foot drift forward. °? Do not let your knee  roll toward the ceiling. °4. Hold this position for __________ seconds. °5. Slowly return your leg to the starting position. °6. Let your muscles relax completely after each repetition. °Repeat __________ times. Complete this exercise __________ times a day. °Straight leg raises °This exercise stretches the muscles that move your hips away from the front of the pelvis (hip extensors). °1. Lie on your abdomen on a firm surface. You can put a pillow under your hips if that is more comfortable. °2. Tense the muscles in your buttocks and lift your left / right leg about 4-6 inches (10-15 cm). Keep your knee straight as you lift your leg. °3. Hold this position for __________ seconds. °4. Slowly lower your leg to the starting position. °5. Let your leg relax completely after each repetition. °Repeat __________ times. Complete this exercise __________ times a day. °This information is not intended to replace advice given to you by your health care provider. Make sure you discuss any questions you have with your health care provider. °Document Released: 03/18/2005 Document Revised: 02/22/2018 Document Reviewed: 02/22/2018 °Elsevier Patient Education © 2020 [employer] °Iliotibial Band Syndrome Rehab °Ask your health care provider which exercises are safe for you. Do exercises exactly as told by your health care provider and adjust them as directed. It is normal to feel mild stretching, pulling, tightness, or discomfort as you do these exercises. Stop right away if you feel sudden pain or your pain gets significantly worse. Do not begin these exercises until told by your health care provider. °Stretching and range-of-motion exercises °These exercises warm up your muscles and joints and improve the movement and flexibility of your hip and pelvis. °Quadriceps stretch, prone ° °1. Lie on your abdomen on a firm surface, such as a bed or padded floor (prone position). °2. Bend your left / right knee and reach back to hold your  ankle or pant leg. If you cannot reach your ankle or pant leg, loop a belt around your foot and grab the belt instead. °3. Gently pull your heel toward your buttocks. Your knee should not slide out to the side. You should feel a stretch in the front of your thigh and knee (quadriceps). °4. Hold this position for __________ seconds. °Repeat __________ times. Complete this exercise __________ times a day. °Iliotibial band stretch °An iliotibial band is a strong band of muscle tissue that runs from the outer side of your hip to the outer side of your thigh and knee. °1. Lie on your side   with your left / right leg in the top position. °2. Bend both of your knees and grab your left / right ankle. Stretch out your bottom arm to help you balance. °3. Slowly bring your top knee back so your thigh goes behind your trunk. °4. Slowly lower your top leg toward the floor until you feel a gentle stretch on the outside of your left / right hip and thigh. If you do not feel a stretch and your knee will not fall farther, place the heel of your other foot on top of your knee and pull your knee down toward the floor with your foot. °5. Hold this position for __________ seconds. °Repeat __________ times. Complete this exercise __________ times a day. °Strengthening exercises °These exercises build strength and endurance in your hip and pelvis. Endurance is the ability to use your muscles for a long time, even after they get tired. °Straight leg raises, side-lying °This exercise strengthens the muscles that rotate the leg at the hip and move it away from your body (hip abductors). °1. Lie on your side with your left / right leg in the top position. Lie so your head, shoulder, hip, and knee line up. You may bend your bottom knee to help you balance. °2. Roll your hips slightly forward so your hips are stacked directly over each other and your left / right knee is facing forward. °3. Tense the muscles in your outer thigh and lift your top  leg 4-6 inches (10-15 cm). °4. Hold this position for __________ seconds. °5. Slowly return to the starting position. Let your muscles relax completely before doing another repetition. °Repeat __________ times. Complete this exercise __________ times a day. °Leg raises, prone °This exercise strengthens the muscles that move the hips (hip extensors). °1. Lie on your abdomen on your bed or a firm surface. You can put a pillow under your hips if that is more comfortable for your lower back. °2. Bend your left / right knee so your foot is straight up in the air. °3. Squeeze your buttocks muscles and lift your left / right thigh off the bed. Do not let your back arch. °4. Tense your thigh muscle as hard as you can without increasing any knee pain. °5. Hold this position for __________ seconds. °6. Slowly lower your leg to the starting position and allow it to relax completely. °Repeat __________ times. Complete this exercise __________ times a day. °Hip hike °1. Stand sideways on a bottom step. Stand on your left / right leg with your other foot unsupported next to the step. You can hold on to the railing or wall for balance if needed. °2. Keep your knees straight and your torso square. Then lift your left / right hip up toward the ceiling. °3. Slowly let your left / right hip lower toward the floor, past the starting position. Your foot should get closer to the floor. Do not lean or bend your knees. °Repeat __________ times. Complete this exercise __________ times a day. °This information is not intended to replace advice given to you by your health care provider. Make sure you discuss any questions you have with your health care provider. °Document Released: 05/04/2005 Document Revised: 08/25/2018 Document Reviewed: 02/23/2018 °Elsevier Patient Education © 2020 [employer] Inc. ° °

## 2019-01-02 NOTE — Progress Notes (Signed)
Office Visit Note  Patient: Kathleen Santos             Date of Birth: 1960/09/26           MRN: [id number]             PCP: Jearld Fenton, NP Referring: Juanita Craver, MD Visit Date: 01/02/2019 Occupation: Surgery coordinator for [employer]  Subjective:  No chief complaint on file.   History of Present Illness: Kathleen Santos is a 58 y.o. female seen in consultation per request of Dr. Collene Mares.  According to patient about 3 years ago she started having pain in her bilateral shoulders.  And then the pain gradually moved to her bilateral thigh area.  She states now she is having pain while she moves her hips and also difficulty sleeping on her side at night.  She has difficulty squatting and getting up from the floor due to discomfort in her knees.  None of the other joints are painful.  She denies any joint swelling.  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  Family history of osteoarthritis in her mother and her sister.  Activities of Daily Living:  Patient reports morning stiffness for 30 minutes.   Patient Reports nocturnal pain.  Difficulty dressing/grooming: Denies Difficulty climbing stairs: Reports Difficulty getting out of chair: Denies Difficulty using hands for taps, buttons, cutlery, and/or writing: Denies  Review of Systems  Constitutional: Negative for fatigue, night sweats, weight gain and weight loss.  HENT: Negative for mouth sores, trouble swallowing, trouble swallowing, mouth dryness and nose dryness.   Eyes: Negative for pain, redness, itching, visual disturbance and dryness.  Respiratory: Negative for cough, shortness of breath, wheezing and difficulty breathing.   Cardiovascular: Negative for chest pain, palpitations, hypertension, irregular heartbeat and swelling in legs/feet.  Gastrointestinal: Negative for abdominal pain, blood in stool, constipation and diarrhea.  Endocrine: Negative for increased  urination.  Genitourinary: Negative for painful urination and vaginal dryness.  Musculoskeletal: Positive for arthralgias, joint pain, morning stiffness and muscle tenderness. Negative for joint swelling, myalgias, muscle weakness and myalgias.  Skin: Negative for color change, rash, hair loss, redness, skin tightness, ulcers and sensitivity to sunlight.  Allergic/Immunologic: Negative for susceptible to infections.  Neurological: Positive for numbness. Negative for dizziness, light-headedness, headaches, memory loss, night sweats and weakness.  Hematological: Negative for bruising/bleeding tendency and swollen glands.  Psychiatric/Behavioral: Positive for sleep disturbance. Negative for depressed mood and confusion. The patient is not nervous/anxious.     PMFS History:  Patient Active Problem List   Diagnosis Date Noted  . Hot flashes 05/20/2016  . Migraines 07/05/2015    Past Medical History:  Diagnosis Date  . Allergy   . Asthma    Childhood  . Migraine     Family History  Problem Relation Age of Onset  . Stroke Mother 33  . Osteoarthritis Mother   . Diabetes Mother   . Heart disease Mother   . Arthritis Mother   . COPD Father   . Throat cancer Father   . Cancer Father   . Heart disease Father   . Cancer Sister        Breast  . Breast cancer Sister        Early 19's  . Cancer Brother        Prostate  . Cancer Sister        Breast  . Breast cancer Sister  Early 50's  . Charcot-Marie-Tooth disease Sister    Past Surgical History:  Procedure Laterality Date  . 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 Influenza,inj,Quad PF,6+ Mos 03/18/2016, 03/12/2017, 03/24/2018  . Tdap 05/28/2014      Objective: Vital Signs: BP 109/74 (BP Location: Right Arm, Patient Position: Sitting, Cuff Size: Normal)   Pulse 80   Resp 13   Ht 5\' 5"  (1.651 m)   Wt 176 lb (79.8 kg)   BMI 29.29 kg/m    Physical Exam Vitals signs and nursing note reviewed.  Constitutional:      Appearance: She is well-developed.  HENT:     Head: Normocephalic and atraumatic.  Eyes:     Conjunctiva/sclera: Conjunctivae normal.  Neck:     Musculoskeletal: Normal range of motion.  Cardiovascular:     Rate and Rhythm: Normal rate and regular rhythm.     Heart sounds: Normal heart sounds.  Pulmonary:     Effort: Pulmonary effort is normal.     Breath sounds: Normal breath sounds.  Abdominal:     General: Bowel sounds are normal.     Palpations: Abdomen is soft.  Lymphadenopathy:     Cervical: No cervical adenopathy.  Skin:    General: Skin is warm and dry.     Capillary Refill: Capillary refill takes less than 2 seconds.  Neurological:     Mental Status: She is alert and oriented to person, place, and time.  Psychiatric:        Behavior: Behavior normal.      Musculoskeletal Exam: C-spine thoracic and lumbar spine with good range of motion.  She had no SI joint tenderness.  Shoulder joints, elbow joints, wrist joints, MCPs were in good range of motion.  She has bilateral DIP thickening consistent with osteoarthritis.  She has good range of motion of bilateral hip joints and knee joints.  Ankle joints with good range of motion.  She has bilateral hallux valgus deformity.  She had tenderness on palpation of bilateral trochanteric bursa.  CDAI Exam: CDAI Score: - Patient Global: -; Provider Global: - Swollen: -; Tender: - Joint Exam   No joint exam has been documented for this visit   There is currently no information documented on the homunculus. Go to the Rheumatology activity and complete the homunculus joint exam.  Investigation: No additional findings.  Imaging: Xr Hips Bilat W Or W/o Pelvis  3-4 Views  Result Date: 01/02/2019 No hip joint narrowing was noted.  No SI joint narrowing was noted.  No chondrocalcinosis was noted. Impression: Unremarkable x-ray of the hip joints and SI joints.  Xr Knee 3 View Left  Result Date: 01/02/2019 Mild medial compartment narrowing was noted.  No chondrocalcinosis was noted.  Mild patellofemoral narrowing was noted. 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  Xr Knee 3 View Right  Result Date: 01/02/2019 Mild medial compartment narrowing was noted.  Moderate patellofemoral narrowing was noted.  No chondrocalcinosis was noted. 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   Recent Labs: Lab Results  Component Value Date  WBC 7.0 03/31/2018   HGB 13.7 03/31/2018   PLT 322.0 03/31/2018   NA 139 03/31/2018   K 4.3 03/31/2018   CL 105 03/31/2018   CO2 29 03/31/2018   GLUCOSE 87 03/31/2018   BUN 19 03/31/2018   CREATININE 0.86 03/31/2018   BILITOT 0.4 03/31/2018   ALKPHOS 56 03/31/2018   AST 21 03/31/2018   ALT 15 03/31/2018   PROT 7.4 03/31/2018   ALBUMIN 4.3 03/31/2018   CALCIUM 9.6 03/31/2018    Speciality Comments: No specialty comments available.  Procedures:  Large Joint Inj: L greater trochanter on 01/02/2019 9:03 AM Indications: pain Details: 27 G 1.5 in needle, lateral approach  Arthrogram: No  Medications: 40 mg triamcinolone acetonide 40 MG/ML; 1.5 mL lidocaine 1 % Aspirate: 0 mL Outcome: tolerated well, no immediate complications Procedure, treatment alternatives, risks and benefits explained, specific risks discussed. Consent was given by the patient. Immediately prior to procedure a time out was called to verify the correct patient, procedure, equipment, support staff and site/side marked as required. Patient was prepped and draped in the usual sterile fashion.      Allergies: Patient has no known allergies.   Assessment / Plan:     Visit Diagnoses: Chronic pain of both hips -patient complains of pain in bilateral hip joints.  She has fairly good range of motion of her hip joints.  Plan: XR HIPS BILAT W OR W/O PELVIS 3-4 VIEWS, x-rays of the bilateral hip joints were unremarkable.  Patient had no hip joint or SI joint narrowing.  Trochanteric bursitis, left hip -she had tenderness on palpation of bilateral trochanteric bursa.  She states she has nocturnal pain when she sleeps on her side.  We discussed the option of cortisone injections and physical therapy.  At this point she just would prefer to do exercises at home.  I have given her a handout on exercises.  Some of the exercises were demonstrated in the office.  After discussing indications side effects contraindications she wanted to have injection to her left trochanteric bursa.  The procedure was performed as prescribed above.  She tolerated the procedure well.  Chronic pain of both knees -she had discomfort in her knee joints on getting up from the squatting position.  Plan: XR KNEE 3 VIEW RIGHT, XR KNEE 3 VIEW LEFT, x-ray showed moderate chondromalacia patella in the right knee joint and mild chondromalacia patella in the left knee joint.  Bilateral knee joint showed mild osteoarthritis.  Handout on knee joint muscle strengthening exercises was given.  Primary osteoarthritis of both hands -she has DIP thickening in her hands consistent with osteoarthritis.  Joint protection muscle strengthening was discussed.  Hx of migraines   Orders: Orders Placed This Encounter  Procedures  . Large Joint Inj  . XR KNEE 3 VIEW RIGHT  . XR KNEE 3 VIEW LEFT  . XR HIPS BILAT W OR W/O PELVIS 3-4 VIEWS   No orders of the defined types were placed in this encounter.   Face-to-face time spent with patient was 45 minutes. Greater than 50% of time was spent in counseling and coordination of care.  Follow-Up  Instructions: Return if symptoms worsen or fail to improve.   Pollyann SavoyShaili Vontae Court, MD  Note - This record has been created using Animal nutritionistDragon software.  Chart creation errors have been sought, but may not always  have been located. Such creation errors do not reflect on  the standard of medical care.

## 2019-01-30 ENCOUNTER — Ambulatory Visit: Payer: BC Managed Care – PPO | Admitting: Rheumatology

## 2019-02-01 ENCOUNTER — Encounter: Payer: Self-pay | Admitting: Internal Medicine

## 2019-02-21 ENCOUNTER — Ambulatory Visit: Payer: BC Managed Care – PPO | Admitting: Rheumatology

## 2019-03-24 ENCOUNTER — Other Ambulatory Visit: Payer: Self-pay | Admitting: Internal Medicine

## 2019-06-01 ENCOUNTER — Other Ambulatory Visit: Payer: Self-pay | Admitting: Internal Medicine

## 2019-06-01 DIAGNOSIS — Z1231 Encounter for screening mammogram for malignant neoplasm of breast: Secondary | ICD-10-CM

## 2019-06-15 ENCOUNTER — Other Ambulatory Visit: Payer: Self-pay | Admitting: Internal Medicine

## 2019-07-01 ENCOUNTER — Other Ambulatory Visit: Payer: Self-pay | Admitting: Internal Medicine

## 2019-07-06 ENCOUNTER — Ambulatory Visit: Payer: BC Managed Care – PPO

## 2019-07-25 ENCOUNTER — Encounter: Payer: Self-pay | Admitting: Internal Medicine

## 2019-07-26 MED ORDER — ESTRADIOL 0.05 MG/24HR TD PTWK: 0.0500 mg | MEDICATED_PATCH | TRANSDERMAL | 0 refills | Status: DC

## 2019-08-04 ENCOUNTER — Other Ambulatory Visit: Payer: Self-pay

## 2019-08-04 ENCOUNTER — Ambulatory Visit
Admission: RE | Admit: 2019-08-04 | Discharge: 2019-08-04 | Disposition: A | Discharge status: Home / Self Care | Payer: BC Managed Care – PPO | Source: Ambulatory Visit | Attending: Internal Medicine | Admitting: Internal Medicine

## 2019-08-04 DIAGNOSIS — Z1231 Encounter for screening mammogram for malignant neoplasm of breast: Secondary | ICD-10-CM

## 2019-08-14 ENCOUNTER — Encounter: Payer: Self-pay | Admitting: Internal Medicine

## 2019-08-14 ENCOUNTER — Ambulatory Visit (INDEPENDENT_AMBULATORY_CARE_PROVIDER_SITE_OTHER): Payer: BC Managed Care – PPO | Admitting: Internal Medicine

## 2019-08-14 ENCOUNTER — Other Ambulatory Visit: Payer: Self-pay

## 2019-08-14 VITALS — BP 108/72 | HR 58 | Temp 98.3°F | Ht 64.75 in | Wt 173.0 lb

## 2019-08-14 DIAGNOSIS — R232 Flushing: Secondary | ICD-10-CM | POA: Diagnosis not present

## 2019-08-14 DIAGNOSIS — Z0000 Encounter for general adult medical examination without abnormal findings: Secondary | ICD-10-CM | POA: Diagnosis not present

## 2019-08-14 DIAGNOSIS — G43809 Other migraine, not intractable, without status migrainosus: Secondary | ICD-10-CM

## 2019-08-14 NOTE — Patient Instructions (Signed)

## 2019-08-14 NOTE — Progress Notes (Signed)
Subjective:    Patient ID: Kathleen Santos, female    DOB: July 27, 1960, 59 y.o.   MRN: [id number]  HPI  Pt presents to the clinic today for her annual exam.   Hot Flashes: She does feel like she is having more ho flashes than usual. Managed on Estradiol patch.   Migraines: None in the last few months. She takes Excedrin Migraine as needed with good relief.  Flu: 01/2019 Tetanus: 05/2014 Covid: not yet Pap Smear: 2015 total hysterectomy Bone Density: 06/2017 Mammogram: 06/3029 Colon Screening 12/2016 Vision Screening: annually  Dentist: biannuallu  Diet: She does eat meat. She consumes fruits and veggies daily. She tries to avoid fried foods. She drinks mostly water and coffee. Exercise: none  Review of Systems      Past Medical History:  Diagnosis Date  . Allergy   . Asthma    Childhood  . Migraine     Current Outpatient Medications  Medication Sig Dispense Refill  . estradiol (CLIMARA - DOSED IN MG/24 HR) 0.05 mg/24hr patch Place 1 patch (0.05 mg total) onto the skin once a week. MUST SCHEDULE PHYSICAL 4 patch 0  . Multiple Vitamin (MULTIVITAMIN) tablet Take 2 tablets by mouth daily.      No current facility-administered medications for this visit.    No Known Allergies  Family History  Problem Relation Age of Onset  . Stroke Mother 69  . Osteoarthritis Mother   . Diabetes Mother   . Heart disease Mother   . Arthritis Mother   . COPD Father   . Throat cancer Father   . Cancer Father   . Heart disease Father   . Cancer Sister        Breast  . Breast cancer Sister        Early 52's  . Cancer Brother        Prostate  . Cancer Sister        Breast  . Breast cancer Sister        Early 83's  . Charcot-Marie-Tooth disease Sister     Social History   Socioeconomic History  . Marital status: Married    Spouse name: Not on file  . Number of children: Not on file  . Years of education: Not on file  . Highest education level: Not on file    Occupational History  . Not on file  Tobacco Use  . Smoking status: Never Smoker  . Smokeless tobacco: Never Used  Substance and Sexual Activity  . Alcohol use: No    Alcohol/week: 0.0 standard drinks  . Drug use: No  . Sexual activity: Yes    Partners: Male    Birth control/protection: Surgical    Comment: Husband  Other Topics Concern  . 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 Difficulty of Paying Living Expenses:   Food Insecurity:   . Worried About Programme researcher, broadcasting/film/video in the Last Year:   . Barista in the Last Year:   Transportation Needs:   . Freight forwarder (Medical):   Marland Kitchen Lack of Transportation (Non-Medical):   Physical Activity:   . Days of Exercise per Week:   .  Minutes of Exercise per Session:   Stress:   . Feeling of Stress :   Social Connections:   . Frequency of Communication with Friends and Family:   . Frequency of Social Gatherings with Friends and Family:   . Attends Religious Services:   . Active Member of Clubs or Organizations:   . Attends Archivist Meetings:   Marland Kitchen Marital Status:   Intimate Partner Violence:   . Fear of Current or Ex-Partner:   . Emotionally Abused:   Marland Kitchen Physically Abused:   . Sexually Abused:      Constitutional: Denies fever, malaise, fatigue, headache or abrupt weight changes.  HEENT: Denies eye pain, eye redness, ear pain, ringing in the ears, wax buildup, runny nose, nasal congestion, bloody nose, or sore throat. Respiratory: Denies difficulty breathing, shortness of breath, cough or sputum production.   Cardiovascular: Denies chest pain, chest tightness, palpitations or swelling in the hands or feet.  Gastrointestinal: Pt reports intermittent reflux. Denies abdominal  pain, bloating, constipation, diarrhea or blood in the stool.  GU: Denies urgency, frequency, pain with urination, burning sensation, blood in urine, odor or discharge. Musculoskeletal: Denies decrease in range of motion, difficulty with gait, muscle pain or joint pain and swelling.  Skin: Denies redness, rashes, lesions or ulcercations.  Neurological: Denies dizziness, difficulty with memory, difficulty with speech or problems with balance and coordination.  Psych: Denies anxiety, depression, SI/HI.  No other specific complaints in a complete review of systems (except as listed in HPI above).  Objective:   Physical Exam  BP 108/72   Pulse (!) 58   Temp 98.3 F (36.8 C) (Temporal)   Ht 5' 4.75" (1.645 m)   Wt 173 lb (78.5 kg)   SpO2 98%   BMI 29.01 kg/m   Wt Readings from Last 3 Encounters:  01/02/19 176 lb (79.8 kg)  10/31/18 170 lb (77.1 kg)  06/01/18 178 lb (80.7 kg)    General: Appears her stated age, well developed, well nourished in NAD. Skin: Warm, dry and intact. No rashes noted. HEENT: Head: normal shape and size; Eyes: sclera white, no icterus, conjunctiva pink, PERRLA and EOMs intact; Ears: Tm's gray and intact, normal light reflex;  Neck:  Neck supple, trachea midline. No masses, lumps or thyromegaly present.  Cardiovascular: Normal rate and rhythm. S1,S2 noted.  No murmur, rubs or gallops noted. No JVD or BLE edema. No carotid bruits noted. Pulmonary/Chest: Normal effort and positive vesicular breath sounds. No respiratory distress. No wheezes, rales or ronchi noted.  Abdomen: Soft and nontender. Normal bowel sounds. No distention or masses noted. Liver, spleen and kidneys non palpable. Musculoskeletal: Strength 5/5 BUE/BLE. No difficulty with gait.  Neurological: Alert and oriented. Cranial nerves II-XII grossly intact. Coordination normal.  Psychiatric: Mood and affect normal. Behavior is normal. Judgment and thought content normal.    BMET    Component Value  Date/Time   NA 139 03/31/2018 0938   K 4.3 03/31/2018 0938   CL 105 03/31/2018 0938   CO2 29 03/31/2018 0938   GLUCOSE 87 03/31/2018 0938   BUN 19 03/31/2018 0938   CREATININE 0.86 03/31/2018 0938   CALCIUM 9.6 03/31/2018 0938    Lipid Panel     Component Value Date/Time   CHOL 145 03/31/2018 0938   TRIG 92.0 03/31/2018 0938   HDL 49.40 03/31/2018 0938   CHOLHDL 3 03/31/2018 0938   VLDL 18.4 03/31/2018 0938   LDLCALC 78 03/31/2018 0938    CBC    Component  Value Date/Time   WBC 7.0 03/31/2018 0938   RBC 4.46 03/31/2018 0938   HGB 13.7 03/31/2018 0938   HCT 40.4 03/31/2018 0938   PLT 322.0 03/31/2018 0938   MCV 90.5 03/31/2018 0938   MCHC 33.9 03/31/2018 0938   RDW 13.6 03/31/2018 0938    Hgb A1C No results found for: HGBA1C          Assessment & Plan:   Preventative Health Maintenance:  Flu shot UTD Tetanus UTD She no longer needs pap smears Mammogram UTD Colon screening UTD Encouraged her to consume a balanced diet and exercise regimen Advised her to see an eye doctor and dentist annually Will check CBC, CMET, Lipid and Vit D today  RTC in 1 year, sooner if needed Nicki Reaper, NP This visit occurred during the SARS-CoV-2 public health emergency.  Safety protocols were in place, including screening questions prior to the visit, additional usage of staff PPE, and extensive cleaning of exam room while observing appropriate contact time as indicated for disinfecting solutions.

## 2019-08-14 NOTE — Assessment & Plan Note (Signed)
Continue Excedrin Migraine prn

## 2019-08-14 NOTE — Assessment & Plan Note (Signed)
Continue Estradiol patch- wean not indicated

## 2019-08-15 LAB — COMPREHENSIVE METABOLIC PANEL
ALT: 17 U/L (ref 0–35)
AST: 24 U/L (ref 0–37)
Albumin: 4.1 g/dL (ref 3.5–5.2)
Alkaline Phosphatase: 65 U/L (ref 39–117)
BUN: 15 mg/dL (ref 6–23)
CO2: 29 mEq/L (ref 19–32)
Calcium: 9.4 mg/dL (ref 8.4–10.5)
Chloride: 102 mEq/L (ref 96–112)
Creatinine, Ser: 0.86 mg/dL (ref 0.40–1.20)
GFR: 67.48 mL/min (ref >60.00)
Glucose, Bld: 75 mg/dL (ref 70–99)
Potassium: 3.9 mEq/L (ref 3.5–5.1)
Sodium: 138 mEq/L (ref 135–145)
Total Bilirubin: 0.3 mg/dL (ref 0.2–1.2)
Total Protein: 7 g/dL (ref 6.0–8.3)

## 2019-08-15 LAB — LIPID PANEL
Cholesterol: 142 mg/dL (ref 0–200)
HDL: 49.6 mg/dL (ref >39.00)
LDL Cholesterol: 76 mg/dL (ref 0–99)
NonHDL: 92.23
Total CHOL/HDL Ratio: 3
Triglycerides: 83 mg/dL (ref 0.0–149.0)
VLDL: 16.6 mg/dL (ref 0.0–40.0)

## 2019-08-15 LAB — CBC
HCT: 39.4 % (ref 36.0–46.0)
Hemoglobin: 13.2 g/dL (ref 12.0–15.0)
MCHC: 33.4 g/dL (ref 30.0–36.0)
MCV: 91.3 fl (ref 78.0–100.0)
Platelets: 309 10*3/uL (ref 150.0–400.0)
RBC: 4.32 Mil/uL (ref 3.87–5.11)
RDW: 13.3 % (ref 11.5–15.5)
WBC: 7 10*3/uL (ref 4.0–10.5)

## 2019-08-15 LAB — VITAMIN D 25 HYDROXY (VIT D DEFICIENCY, FRACTURES): VITD: 43.37 ng/mL (ref 30.00–100.00)

## 2019-08-16 ENCOUNTER — Other Ambulatory Visit: Payer: Self-pay | Admitting: Internal Medicine

## 2019-08-16 ENCOUNTER — Encounter: Payer: Self-pay | Admitting: Internal Medicine

## 2019-08-16 MED ORDER — ESTRADIOL 0.05 MG/24HR TD PTWK: 0.0500 mg | MEDICATED_PATCH | TRANSDERMAL | 10 refills | Status: DC

## 2019-08-25 ENCOUNTER — Ambulatory Visit (INDEPENDENT_AMBULATORY_CARE_PROVIDER_SITE_OTHER): Payer: BC Managed Care – PPO

## 2019-08-25 ENCOUNTER — Ambulatory Visit: Payer: BC Managed Care – PPO | Admitting: Podiatry

## 2019-08-25 ENCOUNTER — Encounter: Payer: Self-pay | Admitting: Podiatry

## 2019-08-25 ENCOUNTER — Other Ambulatory Visit: Payer: Self-pay

## 2019-08-25 VITALS — Temp 97.8°F

## 2019-08-25 DIAGNOSIS — M67471 Ganglion, right ankle and foot: Secondary | ICD-10-CM | POA: Diagnosis not present

## 2019-08-25 DIAGNOSIS — M67472 Ganglion, left ankle and foot: Secondary | ICD-10-CM | POA: Diagnosis not present

## 2019-08-25 DIAGNOSIS — M779 Enthesopathy, unspecified: Secondary | ICD-10-CM

## 2019-08-31 ENCOUNTER — Other Ambulatory Visit: Payer: Self-pay | Admitting: Podiatry

## 2019-08-31 DIAGNOSIS — M67471 Ganglion, right ankle and foot: Secondary | ICD-10-CM

## 2019-08-31 DIAGNOSIS — M67472 Ganglion, left ankle and foot: Secondary | ICD-10-CM

## 2019-09-04 ENCOUNTER — Ambulatory Visit: Payer: Self-pay | Admitting: Podiatry

## 2019-09-04 NOTE — Progress Notes (Signed)
   HPI: 59 y.o. female presenting today as a new patient for evaluation of the lesion to the dorsal aspect of the left foot.  Patient states that she developed a knot on top of her foot.  There is currently no pain associated with this not.  She noticed it last week.  She denies any trauma.  She has been massaging the area daily.  Past Medical History:  Diagnosis Date  . Allergy   . Asthma    Childhood  . Migraine      Physical Exam: General: The patient is alert and oriented x3 in no acute distress.  Dermatology: Skin is warm, dry and supple bilateral lower extremities. Negative for open lesions or macerations.  Vascular: Palpable pedal pulses bilaterally. No edema or erythema noted. Capillary refill within normal limits.  Neurological: Epicritic and protective threshold grossly intact bilaterally.   Musculoskeletal Exam: Range of motion within normal limits to all pedal and ankle joints bilateral. Muscle strength 5/5 in all groups bilateral.  Fluctuant mass noted to the dorsal aspect of the left foot consistent with a ganglion cyst  Radiographic Exam:  Normal osseous mineralization. Joint spaces preserved. No fracture/dislocation/boney destruction.    Assessment: 1.  Ganglion cyst left dorsal foot   Plan of Care:  1. Patient evaluated. X-Rays reviewed.  2.  Today I discussed draining the lesion to drain the fluid from the ganglion cyst.  The patient agrees.  The foot was prepped in aseptic manner and injected with 3 mL of 2% lidocaine plain.  An 18-gauge needle was then inserted into the ganglion cyst and approximately 2 mL of yellowish viscous cystic fluid was removed.  Light dressing applied 3.  Compression anklet dispensed and applied 4.  Return to clinic as needed      Felecia Shelling, DPM [facility]  Dr. Felecia Shelling, DPM    [address] [place], [place] [address]                Office [phone]  Fax [phone]

## 2019-10-10 ENCOUNTER — Other Ambulatory Visit: Payer: Self-pay | Admitting: Internal Medicine

## 2020-03-14 ENCOUNTER — Other Ambulatory Visit: Payer: Self-pay | Admitting: Obstetrics and Gynecology

## 2020-03-14 DIAGNOSIS — Z1231 Encounter for screening mammogram for malignant neoplasm of breast: Secondary | ICD-10-CM

## 2020-06-20 ENCOUNTER — Encounter: Payer: Self-pay | Admitting: Internal Medicine

## 2020-06-21 ENCOUNTER — Other Ambulatory Visit: Payer: Self-pay | Admitting: Internal Medicine

## 2020-07-23 ENCOUNTER — Ambulatory Visit: Payer: BC Managed Care – PPO | Admitting: Rheumatology

## 2020-07-26 NOTE — Progress Notes (Signed)
Office Visit Note  Patient: Kathleen Santos             Date of Birth: 01-12-1961           MRN: [id number]             PCP: Lorre Munroe, NP Referring: Lorre Munroe, NP Visit Date: 08/09/2020 Occupation: @GUAROCC @  Subjective:  Pain in both hips.   History of Present Illness: Kathleen Santos is a 60 y.o. female with history of osteoarthritis.  She returns today after her last visit on January 02, 2019.  At the time she was diagnosed with trochanteric bursitis and osteoarthritis.  She had a cortisone injection which lasted her long time.  She states she has been doing stretching exercises.  She has been experiencing increased pain and discomfort in her trochanteric bursa bilaterally but more so on the left side.  She states been causing nocturnal pain and she gets up in the morning with discomfort.  She retired in December and is not with sitting for prolonged time now.  She is also tried some topical Voltaren gel.  She states her hands are stiff but not swollen.  She is off-and-on discomfort in her knee joints.  Activities of Daily Living:  Patient reports morning stiffness for 5-10 minutes.   Patient Reports nocturnal pain.  Difficulty dressing/grooming: Denies Difficulty climbing stairs: Denies Difficulty getting out of chair: Denies Difficulty using hands for taps, buttons, cutlery, and/or writing: Denies  Review of Systems  Constitutional: Negative for fatigue.  HENT: Negative for mouth sores, mouth dryness and nose dryness.   Eyes: Negative for pain, itching and dryness.  Respiratory: Negative for shortness of breath and difficulty breathing.   Cardiovascular: Negative for chest pain and palpitations.  Gastrointestinal: Negative for blood in stool, constipation and diarrhea.  Endocrine: Negative for increased urination.  Genitourinary: Negative for difficulty urinating.  Musculoskeletal: Positive for morning stiffness. Negative for arthralgias, joint pain, joint  swelling, myalgias, muscle tenderness and myalgias.  Skin: Negative for color change, rash and redness.  Allergic/Immunologic: Negative for susceptible to infections.  Neurological: Negative for dizziness, numbness, headaches, memory loss and weakness.  Hematological: Negative for bruising/bleeding tendency.  Psychiatric/Behavioral: Negative for confusion.    PMFS History:  Patient Active Problem List   Diagnosis Date Noted  . Hot flashes 05/20/2016  . Migraines 07/05/2015    Past Medical History:  Diagnosis Date  . Allergy   . Asthma    Childhood  . Migraine     Family History  Problem Relation Age of Onset  . Stroke Mother 35  . Osteoarthritis Mother   . Diabetes Mother   . Heart disease Mother   . Arthritis Mother   . COPD Father   . Throat cancer Father   . Cancer Father   . Heart disease Father   . Cancer Sister        Breast  . Breast cancer Sister        Early 24's  . Cancer Brother        Prostate  . Cancer Sister        Breast  . Breast cancer Sister        Early 72's  . Charcot-Marie-Tooth disease Sister    Past Surgical History:  Procedure Laterality Date  . ABDOMINAL HYSTERECTOMY  2000   Social History   Social History Narrative   Married   1 year of college   [employer] healthcare- Surgery scheduler  No Children    Caffeine- coffee 2 cups, tea prn   Counseling- 17 years ago after mother passing    Immunization History  Administered Date(s) Administered  . Influenza,inj,Quad PF,6+ Mos 03/18/2016, 03/12/2017, 03/24/2018, 01/25/2019  . Tdap 05/28/2014     Objective: Vital Signs: BP 109/75 (BP Location: Right Arm, Patient Position: Sitting, Cuff Size: Normal)   Pulse 73   Resp 16   Ht 5\' 6"  (1.676 m)   Wt 171 lb 6.4 oz (77.7 kg)   BMI 27.66 kg/m    Physical Exam Vitals and nursing note reviewed.  Constitutional:      Appearance: She is well-developed.  HENT:     Head: Normocephalic and atraumatic.  Eyes:     Conjunctiva/sclera:  Conjunctivae normal.  Cardiovascular:     Rate and Rhythm: Normal rate and regular rhythm.     Heart sounds: Normal heart sounds.  Pulmonary:     Effort: Pulmonary effort is normal.     Breath sounds: Normal breath sounds.  Abdominal:     General: Bowel sounds are normal.     Palpations: Abdomen is soft.  Musculoskeletal:     Cervical back: Normal range of motion.  Lymphadenopathy:     Cervical: No cervical adenopathy.  Skin:    General: Skin is warm and dry.     Capillary Refill: Capillary refill takes less than 2 seconds.  Neurological:     Mental Status: She is alert and oriented to person, place, and time.  Psychiatric:        Behavior: Behavior normal.      Musculoskeletal Exam: C-spine thoracic and lumbar spine with good range of motion.  Shoulder joints, elbow joints, wrist joints with good range of motion.  She had bilateral PIP and DIP thickening with no synovitis.  Hip joints were in good range of motion.  She tenderness over bilateral trochanteric bursa more prominent on the left side.  Knee joints are in good range of motion without any warmth swelling or effusion.  There was no tenderness over ankles or MTPs.  CDAI Exam: CDAI Score: - Patient Global: -; Provider Global: - Swollen: -; Tender: - Joint Exam 08/09/2020   No joint exam has been documented for this visit   There is currently no information documented on the homunculus. Go to the Rheumatology activity and complete the homunculus joint exam.  Investigation: No additional findings.  Imaging: MM 3D SCREEN BREAST BILATERAL  Result Date: 08/05/2020 CLINICAL DATA:  Screening. EXAM: DIGITAL SCREENING BILATERAL MAMMOGRAM WITH TOMOSYNTHESIS AND CAD TECHNIQUE: Bilateral screening digital craniocaudal and mediolateral oblique mammograms were obtained. Bilateral screening digital breast tomosynthesis was performed. The images were evaluated with computer-aided detection. COMPARISON:  Previous exam(s). ACR Breast  Density Category d: The breast tissue is extremely dense, which lowers the sensitivity of mammography FINDINGS: There are no findings suspicious for malignancy. The images were evaluated with computer-aided detection. IMPRESSION: No mammographic evidence of malignancy. A result letter of this screening mammogram will be mailed directly to the patient. RECOMMENDATION: Screening mammogram in one year. (Code:SM-B-01Y) BI-RADS CATEGORY  1: Negative. Electronically Signed   By: 08/07/2020 M.D.   On: 08/05/2020 16:52    Recent Labs: Lab Results  Component Value Date   WBC 7.0 08/14/2019   HGB 13.2 08/14/2019   PLT 309.0 08/14/2019   NA 138 08/14/2019   K 3.9 08/14/2019   CL 102 08/14/2019   CO2 29 08/14/2019   GLUCOSE 75 08/14/2019   BUN 15 08/14/2019  CREATININE 0.86 08/14/2019   BILITOT 0.3 08/14/2019   ALKPHOS 65 08/14/2019   AST 24 08/14/2019   ALT 17 08/14/2019   PROT 7.0 08/14/2019   ALBUMIN 4.1 08/14/2019   CALCIUM 9.4 08/14/2019    Speciality Comments: No specialty comments available.  Procedures:  Large Joint Inj: L greater trochanter on 08/09/2020 11:35 AM Indications: pain Details: 27 G 1.5 in needle, lateral approach  Arthrogram: No  Medications: 40 mg triamcinolone acetonide 40 MG/ML; 1.5 mL lidocaine 1 % Aspirate: 0 mL Outcome: tolerated well, no immediate complications Procedure, treatment alternatives, risks and benefits explained, specific risks discussed. Consent was given by the patient. Immediately prior to procedure a time out was called to verify the correct patient, procedure, equipment, support staff and site/side marked as required. Patient was prepped and draped in the usual sterile fashion.     Allergies: Patient has no known allergies.   Assessment / Plan:     Visit Diagnoses: Chronic pain of both hips - x-rays of the bilateral hip joints were unremarkable.    Trochanteric bursitis, left hip-she had tenderness on palpation over left  trochanteric bursa.  She has been doing stretching exercises without much improvement.  She is also using topical Voltaren gel.  She has had very good response to cortisone injection at the last visit in 2020.  Per her request left trochanteric bursa was injected as described above.  She tolerated the procedure well.  Postprocedure instructions were given.  I have advised her to continue with the stretching exercises.  I also offered physical therapy but she declined.  She states she will contact me in case she wants to go for physical therapy.  Primary osteoarthritis of both knees - Bilateral mild osteoarthritis and moderate chondromalacia patella.  X-ray findings of knee joints were discussed.  Lower extremity muscle strengthening exercises were emphasized and a handout was placed in the AVS.  Primary osteoarthritis of both hands-she continues to have some stiffness in her hands.  She has bilateral PIP and DIP thickening.  Counseled regarding osteoarthritis was provided.  Joint protection muscle strengthening was discussed.  Handout on hand exercises was placed in the AVS.  Hx of migraines  Orders: Orders Placed This Encounter  Procedures  . Large Joint Inj: L greater trochanter   No orders of the defined types were placed in this encounter.    Follow-Up Instructions: Return if symptoms worsen or fail to improve, for Osteoarthritis.   Pollyann Savoy, MD  Note - This record has been created using Animal nutritionist.  Chart creation errors have been sought, but may not always  have been located. Such creation errors do not reflect on  the standard of medical care.

## 2020-08-05 ENCOUNTER — Other Ambulatory Visit: Payer: Self-pay

## 2020-08-05 ENCOUNTER — Ambulatory Visit
Admission: RE | Admit: 2020-08-05 | Discharge: 2020-08-05 | Disposition: A | Discharge status: Home / Self Care | Payer: BC Managed Care – PPO | Source: Ambulatory Visit | Attending: Obstetrics and Gynecology | Admitting: Obstetrics and Gynecology

## 2020-08-05 DIAGNOSIS — Z1231 Encounter for screening mammogram for malignant neoplasm of breast: Secondary | ICD-10-CM

## 2020-08-09 ENCOUNTER — Encounter: Payer: Self-pay | Admitting: Rheumatology

## 2020-08-09 ENCOUNTER — Other Ambulatory Visit: Payer: Self-pay

## 2020-08-09 ENCOUNTER — Ambulatory Visit: Payer: BC Managed Care – PPO | Admitting: Rheumatology

## 2020-08-09 VITALS — BP 109/75 | HR 73 | Resp 16 | Ht 66.0 in | Wt 171.4 lb

## 2020-08-09 DIAGNOSIS — M25551 Pain in right hip: Secondary | ICD-10-CM | POA: Diagnosis not present

## 2020-08-09 DIAGNOSIS — M170 Bilateral primary osteoarthritis of knee: Secondary | ICD-10-CM | POA: Diagnosis not present

## 2020-08-09 DIAGNOSIS — M19042 Primary osteoarthritis, left hand: Secondary | ICD-10-CM

## 2020-08-09 DIAGNOSIS — M7062 Trochanteric bursitis, left hip: Secondary | ICD-10-CM | POA: Diagnosis not present

## 2020-08-09 DIAGNOSIS — M19041 Primary osteoarthritis, right hand: Secondary | ICD-10-CM | POA: Diagnosis not present

## 2020-08-09 DIAGNOSIS — Z8669 Personal history of other diseases of the nervous system and sense organs: Secondary | ICD-10-CM

## 2020-08-09 DIAGNOSIS — M25552 Pain in left hip: Secondary | ICD-10-CM

## 2020-08-09 DIAGNOSIS — G8929 Other chronic pain: Secondary | ICD-10-CM

## 2020-08-09 MED ORDER — LIDOCAINE HCL 1 % IJ SOLN
1.5000 mL | INTRAMUSCULAR | Status: AC | PRN
  Administered 2020-08-09: 1.5 mL

## 2020-08-09 MED ORDER — TRIAMCINOLONE ACETONIDE 40 MG/ML IJ SUSP
40.0000 mg | INTRAMUSCULAR | Status: AC | PRN
  Administered 2020-08-09: 40 mg via INTRA_ARTICULAR

## 2020-08-09 NOTE — Patient Instructions (Signed)
Journal for Nurse Practitioners, 15(4), 263-267. Retrieved February 21, 2018 from [email or web address]">  Knee Exercises Ask your health care provider which exercises are safe for you. Do exercises exactly as told by your health care provider and adjust them as directed. It is normal to feel mild stretching, pulling, tightness, or discomfort as you do these exercises. Stop right away if you feel sudden pain or your pain gets worse. Do not begin these exercises until told by your health care provider. Stretching and range-of-motion exercises These exercises warm up your muscles and joints and improve the movement and flexibility of your knee. These exercises also help to relieve pain and swelling. Knee extension, prone 1. Lie on your abdomen (prone position) on a bed. 2. Place your left / right knee just beyond the edge of the surface so your knee is not on the bed. You can put a towel under your left / right thigh just above your kneecap for comfort. 3. Relax your leg muscles and allow gravity to straighten your knee (extension). You should feel a stretch behind your left / right knee. 4. Hold this position for __________ seconds. 5. Scoot up so your knee is supported between repetitions. Repeat __________ times. Complete this exercise __________ times a day. Knee flexion, active 1. Lie on your back with both legs straight. If this causes back discomfort, bend your left / right knee so your foot is flat on the floor. 2. Slowly slide your left / right heel back toward your buttocks. Stop when you feel a gentle stretch in the front of your knee or thigh (flexion). 3. Hold this position for __________ seconds. 4. Slowly slide your left / right heel back to the starting position. Repeat __________ times. Complete this exercise __________ times a day.   Quadriceps stretch, prone 1. Lie on your abdomen on a firm surface, such as a bed or padded floor. 2. Bend your left / right knee and hold  your ankle. If you cannot reach your ankle or pant leg, loop a belt around your foot and grab the belt instead. 3. Gently pull your heel toward your buttocks. Your knee should not slide out to the side. You should feel a stretch in the front of your thigh and knee (quadriceps). 4. Hold this position for __________ seconds. Repeat __________ times. Complete this exercise __________ times a day.   Hamstring, supine 1. Lie on your back (supine position). 2. Loop a belt or towel over the ball of your left / right foot. The ball of your foot is on the walking surface, right under your toes. 3. Straighten your left / right knee and slowly pull on the belt to raise your leg until you feel a gentle stretch behind your knee (hamstring). ? Do not let your knee bend while you do this. ? Keep your other leg flat on the floor. 4. Hold this position for __________ seconds. Repeat __________ times. Complete this exercise __________ times a day. Strengthening exercises These exercises build strength and endurance in your knee. Endurance is the ability to use your muscles for a long time, even after they get tired. Quadriceps, isometric This exercise stretches the muscles in front of your thigh (quadriceps) without moving your knee joint (isometric). 1. Lie on your back with your left / right leg extended and your other knee bent. Put a rolled towel or small pillow under your knee if told by your health care provider. 2. Slowly tense the muscles in the front of your   left / right thigh. You should see your kneecap slide up toward your hip or see increased dimpling just above the knee. This motion will push the back of the knee toward the floor. 3. For __________ seconds, hold the muscle as tight as you can without increasing your pain. 4. Relax the muscles slowly and completely. Repeat __________ times. Complete this exercise __________ times a day.   Straight leg raises This exercise stretches the muscles in  front of your thigh (quadriceps) and the muscles that move your hips (hip flexors). 1. Lie on your back with your left / right leg extended and your other knee bent. 2. Tense the muscles in the front of your left / right thigh. You should see your kneecap slide up or see increased dimpling just above the knee. Your thigh may even shake a bit. 3. Keep these muscles tight as you raise your leg 4-6 inches (10-15 cm) off the floor. Do not let your knee bend. 4. Hold this position for __________ seconds. 5. Keep these muscles tense as you lower your leg. 6. Relax your muscles slowly and completely after each repetition. Repeat __________ times. Complete this exercise __________ times a day. Hamstring, isometric 1. Lie on your back on a firm surface. 2. Bend your left / right knee about __________ degrees. 3. Dig your left / right heel into the surface as if you are trying to pull it toward your buttocks. Tighten the muscles in the back of your thighs (hamstring) to "dig" as hard as you can without increasing any pain. 4. Hold this position for __________ seconds. 5. Release the tension gradually and allow your muscles to relax completely for __________ seconds after each repetition. Repeat __________ times. Complete this exercise __________ times a day. Hamstring curls If told by your health care provider, do this exercise while wearing ankle weights. Begin with __________ lb weights. Then increase the weight by 1 lb (0.5 kg) increments. Do not wear ankle weights that are more than __________ lb. 1. Lie on your abdomen with your legs straight. 2. Bend your left / right knee as far as you can without feeling pain. Keep your hips flat against the floor. 3. Hold this position for __________ seconds. 4. Slowly lower your leg to the starting position. Repeat __________ times. Complete this exercise __________ times a day.   Squats This exercise strengthens the muscles in front of your thigh and knee  (quadriceps). 1. Stand in front of a table, with your feet and knees pointing straight ahead. You may rest your hands on the table for balance but not for support. 2. Slowly bend your knees and lower your hips like you are going to sit in a chair. ? Keep your weight over your heels, not over your toes. ? Keep your lower legs upright so they are parallel with the table legs. ? Do not let your hips go lower than your knees. ? Do not bend lower than told by your health care provider. ? If your knee pain increases, do not bend as low. 3. Hold the squat position for __________ seconds. 4. Slowly push with your legs to return to standing. Do not use your hands to pull yourself to standing. Repeat __________ times. Complete this exercise __________ times a day. Wall slides This exercise strengthens the muscles in front of your thigh and knee (quadriceps). 1. Lean your back against a smooth wall or door, and walk your feet out 18-24 inches (46-61 cm) from it. 2.   Place your feet hip-width apart. 3. Slowly slide down the wall or door until your knees bend __________ degrees. Keep your knees over your heels, not over your toes. Keep your knees in line with your hips. 4. Hold this position for __________ seconds. Repeat __________ times. Complete this exercise __________ times a day.   Straight leg raises This exercise strengthens the muscles that rotate the leg at the hip and move it away from your body (hip abductors). 1. Lie on your side with your left / right leg in the top position. Lie so your head, shoulder, knee, and hip line up. You may bend your bottom knee to help you keep your balance. 2. Roll your hips slightly forward so your hips are stacked directly over each other and your left / right knee is facing forward. 3. Leading with your heel, lift your top leg 4-6 inches (10-15 cm). You should feel the muscles in your outer hip lifting. ? Do not let your foot drift forward. ? Do not let your  knee roll toward the ceiling. 4. Hold this position for __________ seconds. 5. Slowly return your leg to the starting position. 6. Let your muscles relax completely after each repetition. Repeat __________ times. Complete this exercise __________ times a day.   Straight leg raises This exercise stretches the muscles that move your hips away from the front of the pelvis (hip extensors). 1. Lie on your abdomen on a firm surface. You can put a pillow under your hips if that is more comfortable. 2. Tense the muscles in your buttocks and lift your left / right leg about 4-6 inches (10-15 cm). Keep your knee straight as you lift your leg. 3. Hold this position for __________ seconds. 4. Slowly lower your leg to the starting position. 5. Let your leg relax completely after each repetition. Repeat __________ times. Complete this exercise __________ times a day. This information is not intended to replace advice given to you by your health care provider. Make sure you discuss any questions you have with your health care provider. Document Revised: 02/22/2018 Document Reviewed: 02/22/2018 [employer] Patient Education  2021 [employer] Hand Exercises Hand exercises can be helpful for almost anyone. These exercises can strengthen the hands, improve flexibility and movement, and increase blood flow to the hands. These results can make work and daily tasks easier. Hand exercises can be especially helpful for people who have joint pain from arthritis or have nerve damage from overuse (carpal tunnel syndrome). These exercises can also help people who have injured a hand. Exercises Most of these hand exercises are gentle stretching and motion exercises. It is usually safe to do them often throughout the day. Warming up your hands before exercise may help to reduce stiffness. You can do this with gentle massage or by placing your hands in warm water for 10-15 minutes. It is normal to feel some stretching,  pulling, tightness, or mild discomfort as you begin new exercises. This will gradually improve. Stop an exercise right away if you feel sudden, severe pain or your pain gets worse. Ask your health care provider which exercises are best for you. Knuckle bend or "claw" fist 1. Stand or sit with your arm, hand, and all five fingers pointed straight up. Make sure to keep your wrist straight during the exercise. 2. Gently bend your fingers down toward your palm until the tips of your fingers are touching the top of your palm. Keep your big knuckle straight and just bend the   small knuckles in your fingers. 3. Hold this position for __________ seconds. 4. Straighten (extend) your fingers back to the starting position. Repeat this exercise 5-10 times with each hand. Full finger fist 1. Stand or sit with your arm, hand, and all five fingers pointed straight up. Make sure to keep your wrist straight during the exercise. 2. Gently bend your fingers into your palm until the tips of your fingers are touching the middle of your palm. 3. Hold this position for __________ seconds. 4. Extend your fingers back to the starting position, stretching every joint fully. Repeat this exercise 5-10 times with each hand. Straight fist 1. Stand or sit with your arm, hand, and all five fingers pointed straight up. Make sure to keep your wrist straight during the exercise. 2. Gently bend your fingers at the big knuckle, where your fingers meet your hand, and the middle knuckle. Keep the knuckle at the tips of your fingers straight and try to touch the bottom of your palm. 3. Hold this position for __________ seconds. 4. Extend your fingers back to the starting position, stretching every joint fully. Repeat this exercise 5-10 times with each hand. Tabletop 1. Stand or sit with your arm, hand, and all five fingers pointed straight up. Make sure to keep your wrist straight during the exercise. 2. Gently bend your fingers at the  big knuckle, where your fingers meet your hand, as far down as you can while keeping the small knuckles in your fingers straight. Think of forming a tabletop with your fingers. 3. Hold this position for __________ seconds. 4. Extend your fingers back to the starting position, stretching every joint fully. Repeat this exercise 5-10 times with each hand. Finger spread 1. Place your hand flat on a table with your palm facing down. Make sure your wrist stays straight as you do this exercise. 2. Spread your fingers and thumb apart from each other as far as you can until you feel a gentle stretch. Hold this position for __________ seconds. 3. Bring your fingers and thumb tight together again. Hold this position for __________ seconds. Repeat this exercise 5-10 times with each hand. Making circles 1. Stand or sit with your arm, hand, and all five fingers pointed straight up. Make sure to keep your wrist straight during the exercise. 2. Make a circle by touching the tip of your thumb to the tip of your index finger. 3. Hold for __________ seconds. Then open your hand wide. 4. Repeat this motion with your thumb and each finger on your hand. Repeat this exercise 5-10 times with each hand. Thumb motion 1. Sit with your forearm resting on a table and your wrist straight. Your thumb should be facing up toward the ceiling. Keep your fingers relaxed as you move your thumb. 2. Lift your thumb up as high as you can toward the ceiling. Hold for __________ seconds. 3. Bend your thumb across your palm as far as you can, reaching the tip of your thumb for the small finger (pinkie) side of your palm. Hold for __________ seconds. Repeat this exercise 5-10 times with each hand. Grip strengthening 1. Hold a stress ball or other soft ball in the middle of your hand. 2. Slowly increase the pressure, squeezing the ball as much as you can without causing pain. Think of bringing the tips of your fingers into the middle of  your palm. All of your finger joints should bend when doing this exercise. 3. Hold your squeeze for __________ seconds,   then relax. Repeat this exercise 5-10 times with each hand.   Contact a health care provider if:  Your hand pain or discomfort gets much worse when you do an exercise.  Your hand pain or discomfort does not improve within 2 hours after you exercise. If you have any of these problems, stop doing these exercises right away. Do not do them again unless your health care provider says that you can. Get help right away if:  You develop sudden, severe hand pain or swelling. If this happens, stop doing these exercises right away. Do not do them again unless your health care provider says that you can. This information is not intended to replace advice given to you by your health care provider. Make sure you discuss any questions you have with your health care provider. Document Revised: 08/25/2018 Document Reviewed: 05/05/2018 Elsevier Patient Education  2021 [employer]  

## 2020-09-15 ENCOUNTER — Other Ambulatory Visit: Payer: Self-pay | Admitting: Internal Medicine

## 2020-09-17 NOTE — Telephone Encounter (Signed)
Patient will be staying with [facility]. Review for refills

## 2021-01-01 ENCOUNTER — Other Ambulatory Visit: Payer: Self-pay | Admitting: Family

## 2021-01-31 ENCOUNTER — Ambulatory Visit (INDEPENDENT_AMBULATORY_CARE_PROVIDER_SITE_OTHER): Payer: BC Managed Care – PPO

## 2021-01-31 ENCOUNTER — Other Ambulatory Visit: Payer: Self-pay

## 2021-01-31 ENCOUNTER — Ambulatory Visit: Payer: BC Managed Care – PPO | Admitting: Podiatry

## 2021-01-31 DIAGNOSIS — S90851A Superficial foreign body, right foot, initial encounter: Secondary | ICD-10-CM

## 2021-01-31 DIAGNOSIS — M79674 Pain in right toe(s): Secondary | ICD-10-CM | POA: Diagnosis not present

## 2021-01-31 DIAGNOSIS — B351 Tinea unguium: Secondary | ICD-10-CM | POA: Diagnosis not present

## 2021-01-31 DIAGNOSIS — M79675 Pain in left toe(s): Secondary | ICD-10-CM | POA: Diagnosis not present

## 2021-01-31 DIAGNOSIS — S9031XA Contusion of right foot, initial encounter: Secondary | ICD-10-CM

## 2021-02-04 NOTE — Progress Notes (Signed)
   HPI: 60 y.o. female presenting today with a new complaint regarding right foot pain is been going on since 01/29/2021.  Patient states that she stepped outside with no shoes and she felt something stuck in her foot.  She tried to take it out but was unable to remove the entire lesion.  It is very tender to touch.  Patient also presents today to discuss her thick discolored dystrophic nails to the bilateral great toes.  She is concerned for toenail fungus.  She has not done anything for treatment.  Past Medical History:  Diagnosis Date   Allergy    Asthma    Childhood   Migraine      Physical Exam: General: The patient is alert and oriented x3 in no acute distress.  Dermatology: Skin is warm, dry and supple bilateral lower extremities. Negative for open lesions or macerations.  Hyperkeratotic thickened discolored dystrophic nails noted to the bilateral great toes consistent with findings of onychomycosis of the toenails  Foreign body splinter noted to the plantar aspect of the right heel with some callus formation around the area.  No active bleeding and no significant erythema.  There is associated tenderness to palpation  Vascular: Palpable pedal pulses bilaterally. No edema or erythema noted. Capillary refill within normal limits.  Neurological: Epicritic and protective threshold grossly intact bilaterally.   Musculoskeletal Exam: No pedal deformities noted  Radiographic Exam:  Normal osseous mineralization. Joint spaces preserved. No fracture/dislocation/boney destruction.  Unable to identify a foreign body  Assessment: 1.  Foreign body/splinter right plantar heel 2.  Onychomycosis of toenails bilateral great toes   Plan of Care:  1. Patient evaluated. X-Rays reviewed.  2.  Light debridement of the area was performed to the foreign body and the foreign body was identified and removed in its entirety.  The patient felt immediate relief.  Light antibiotic ointment and a  Band-Aid was applied 3.  In regards to the onychomycosis of the toenails, recommend OTC topical formula 3 antifungal nail lacquer which was provided here in the office today 4.  Return to clinic as needed      Felecia Shelling, DPM [facility]  Dr. Felecia Shelling, DPM    [address] [place], [place] [address]                Office [phone]  Fax [phone]

## 2021-04-25 ENCOUNTER — Other Ambulatory Visit: Payer: Self-pay | Admitting: Family Medicine

## 2021-04-25 DIAGNOSIS — Z1231 Encounter for screening mammogram for malignant neoplasm of breast: Secondary | ICD-10-CM

## 2021-08-07 ENCOUNTER — Ambulatory Visit: Payer: BC Managed Care – PPO

## 2021-08-08 ENCOUNTER — Ambulatory Visit
Admission: RE | Admit: 2021-08-08 | Discharge: 2021-08-08 | Disposition: A | Discharge status: Home / Self Care | Payer: BC Managed Care – PPO | Source: Ambulatory Visit | Attending: Family Medicine | Admitting: Family Medicine

## 2021-08-08 DIAGNOSIS — Z1231 Encounter for screening mammogram for malignant neoplasm of breast: Secondary | ICD-10-CM

## 2021-08-18 IMAGING — MG DIGITAL SCREENING BILAT W/ TOMO W/ CAD
8 series · 9 of 24 positions shown · non-contrast
Comparison: Previous exam(s).

CLINICAL DATA: Screening.

EXAM:
DIGITAL SCREENING BILATERAL MAMMOGRAM WITH TOMO AND CAD

[L MLO synth-2D]
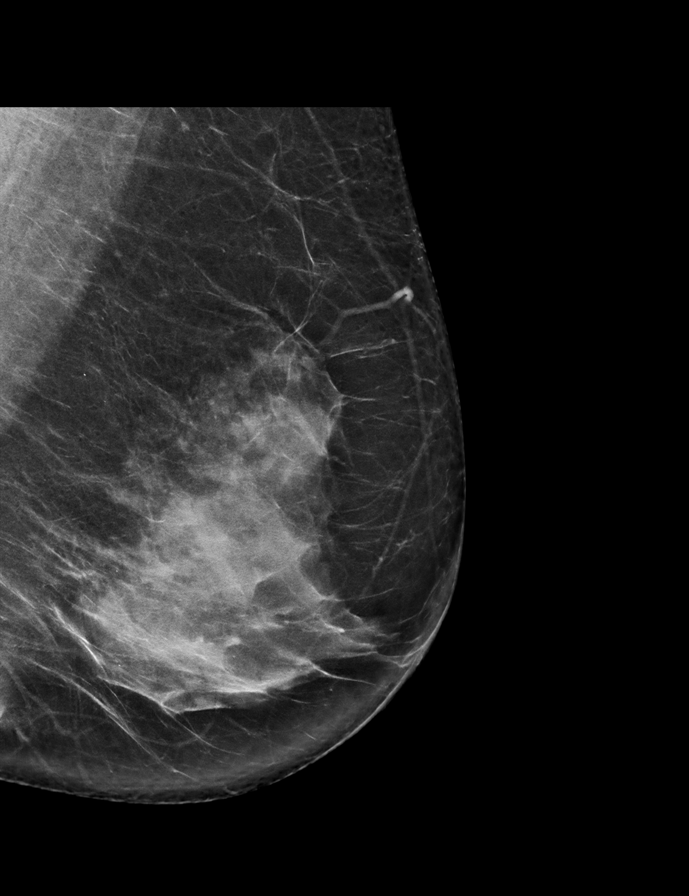

[R MLO synth-2D]
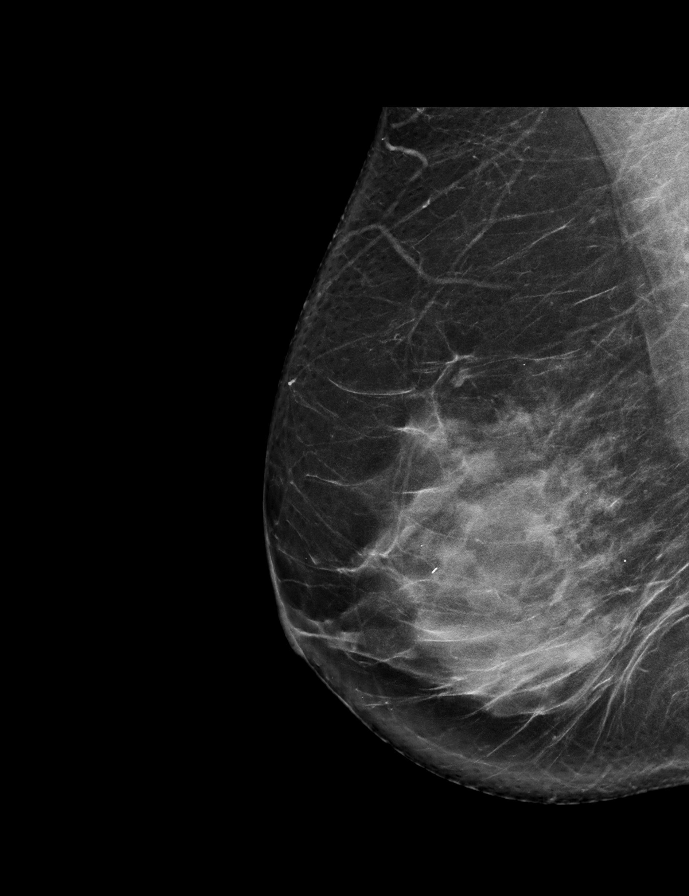

[L CC synth-2D]
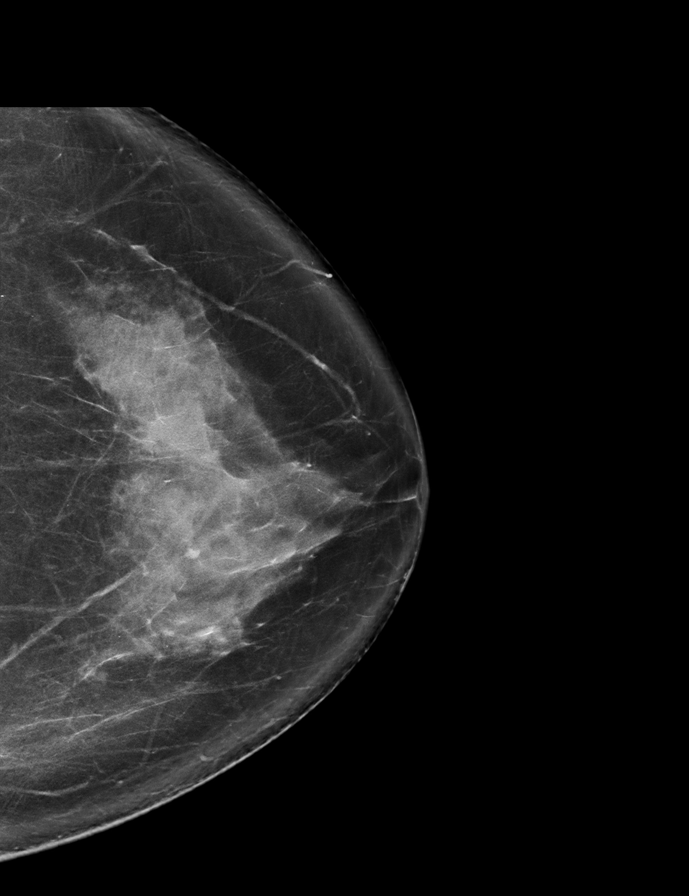

[R CC synth-2D]
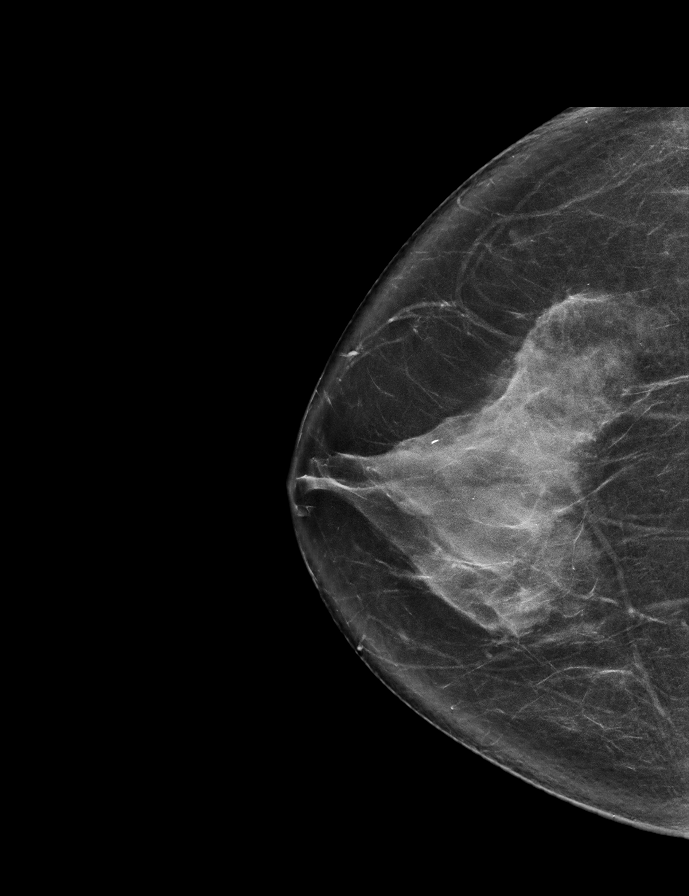

[R CC tomo · 2 of 76 frames shown]
[frame 25/76]
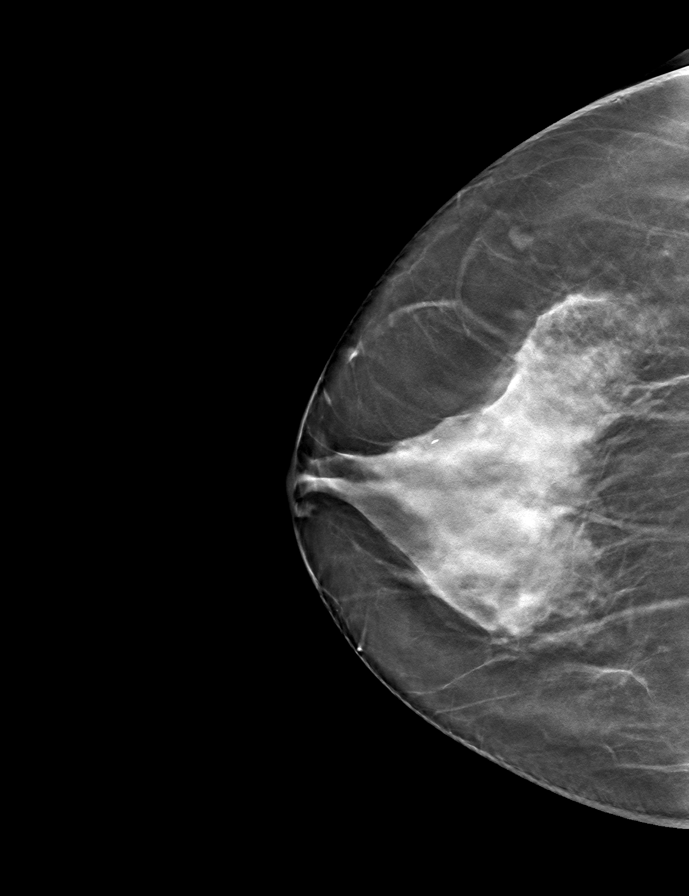
[frame 39/76]
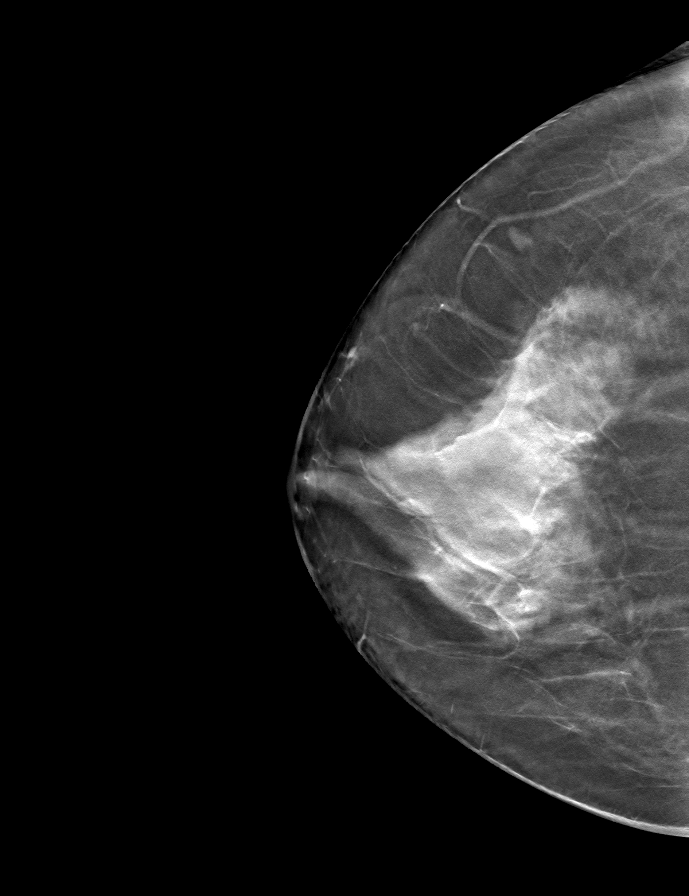

[R MLO tomo · tomo slice 37/74.0]
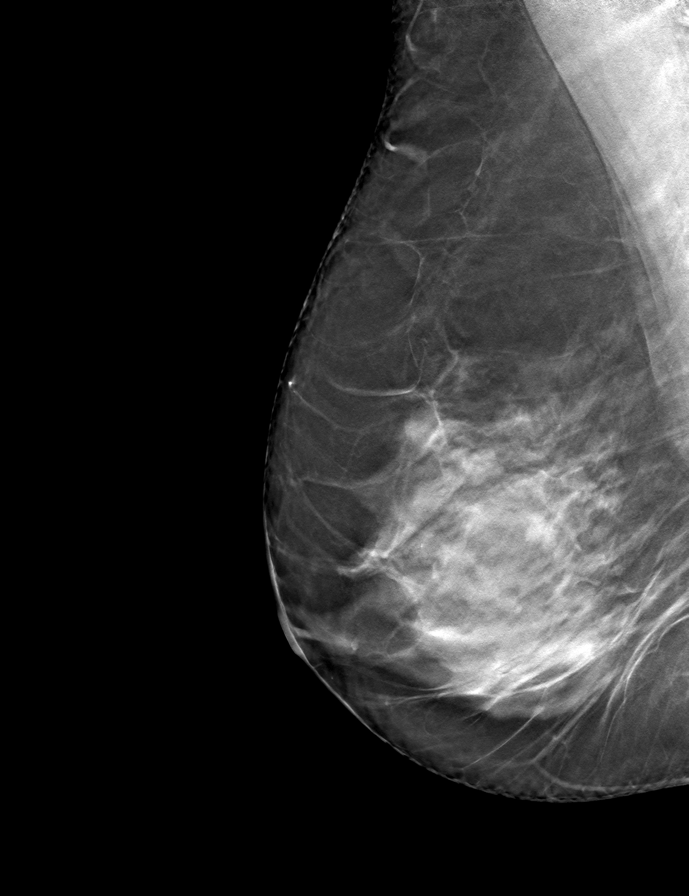

[L CC tomo · tomo slice 41/82.0]
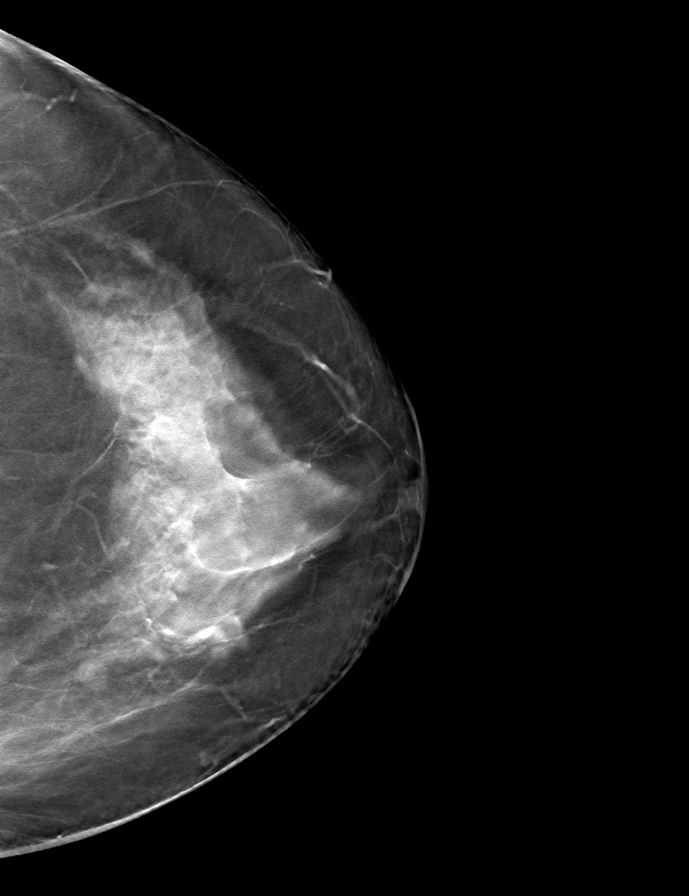

[L MLO tomo · tomo slice 41/80.0]
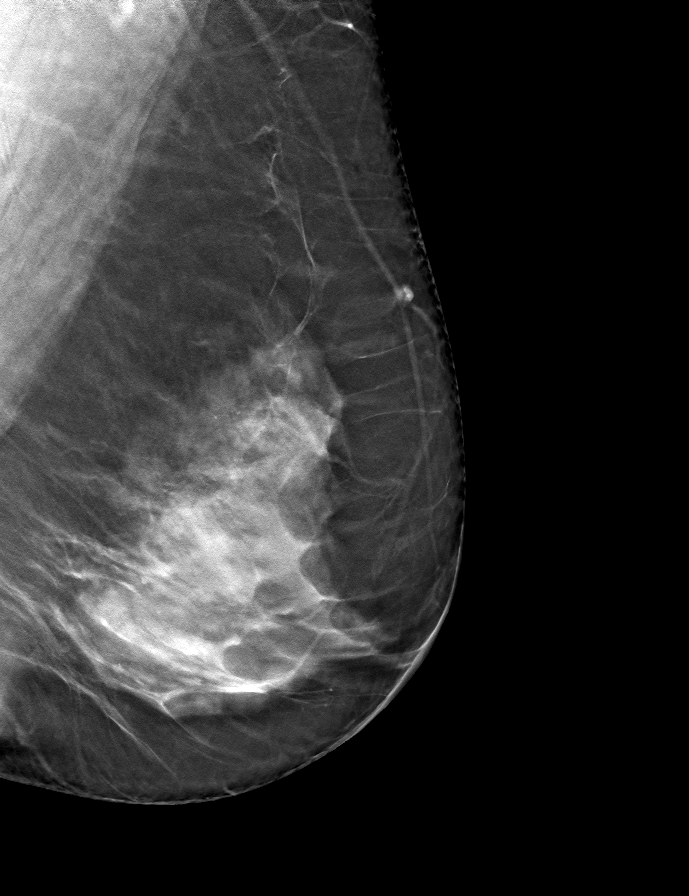

[9 of 24 positions shown; findings below may reference images not displayed]

ACR Breast Density Category c: The breast tissue is heterogeneously
dense, which may obscure small masses.
FINDINGS: There are no findings suspicious for malignancy. Images were
processed with CAD.
IMPRESSION: No mammographic evidence of malignancy. A result letter of this
screening mammogram will be mailed directly to the patient.

RECOMMENDATION:
Screening mammogram in one year. (Code:FT-[id number])

BI-RADS CATEGORY  1: Negative.

## 2021-10-02 ENCOUNTER — Ambulatory Visit: Payer: BC Managed Care – PPO | Admitting: Rheumatology

## 2021-10-02 ENCOUNTER — Encounter: Payer: Self-pay | Admitting: Rheumatology

## 2021-10-02 VITALS — BP 96/66 | HR 62 | Ht 66.0 in | Wt 166.0 lb

## 2021-10-02 DIAGNOSIS — M170 Bilateral primary osteoarthritis of knee: Secondary | ICD-10-CM

## 2021-10-02 DIAGNOSIS — M25551 Pain in right hip: Secondary | ICD-10-CM | POA: Diagnosis not present

## 2021-10-02 DIAGNOSIS — M19041 Primary osteoarthritis, right hand: Secondary | ICD-10-CM | POA: Diagnosis not present

## 2021-10-02 DIAGNOSIS — M19042 Primary osteoarthritis, left hand: Secondary | ICD-10-CM

## 2021-10-02 DIAGNOSIS — Z8669 Personal history of other diseases of the nervous system and sense organs: Secondary | ICD-10-CM

## 2021-10-02 DIAGNOSIS — G8929 Other chronic pain: Secondary | ICD-10-CM

## 2021-10-02 DIAGNOSIS — M7062 Trochanteric bursitis, left hip: Secondary | ICD-10-CM | POA: Diagnosis not present

## 2021-10-02 DIAGNOSIS — M25552 Pain in left hip: Secondary | ICD-10-CM

## 2021-10-02 MED ORDER — LIDOCAINE HCL 1 % IJ SOLN
1.5000 mL | INTRAMUSCULAR | Status: AC | PRN
  Administered 2021-10-02: 1.5 mL

## 2021-10-02 MED ORDER — TRIAMCINOLONE ACETONIDE 40 MG/ML IJ SUSP
40.0000 mg | INTRAMUSCULAR | Status: AC | PRN
  Administered 2021-10-02: 40 mg via INTRA_ARTICULAR

## 2021-10-02 NOTE — Patient Instructions (Signed)
Hand Exercises Hand exercises can be helpful for almost anyone. These exercises can strengthen the hands, improve flexibility and movement, and increase blood flow to the hands. These results can make work and daily tasks easier. Hand exercises can be especially helpful for people who have joint pain from arthritis or have nerve damage from overuse (carpal tunnel syndrome). These exercises can also help people who have injured a hand. Exercises Most of these hand exercises are gentle stretching and motion exercises. It is usually safe to do them often throughout the day. Warming up your hands before exercise may help to reduce stiffness. You can do this with gentle massage or by placing your hands in warm water for 10-15 minutes. It is normal to feel some stretching, pulling, tightness, or mild discomfort as you begin new exercises. This will gradually improve. Stop an exercise right away if you feel sudden, severe pain or your pain gets worse. Ask your health care provider which exercises are best for you. Knuckle bend or "claw" fist  Stand or sit with your arm, hand, and all five fingers pointed straight up. Make sure to keep your wrist straight during the exercise. Gently bend your fingers down toward your palm until the tips of your fingers are touching the top of your palm. Keep your big knuckle straight and just bend the small knuckles in your fingers. Hold this position for __________ seconds. Straighten (extend) your fingers back to the starting position. Repeat this exercise 5-10 times with each hand. Full finger fist  Stand or sit with your arm, hand, and all five fingers pointed straight up. Make sure to keep your wrist straight during the exercise. Gently bend your fingers into your palm until the tips of your fingers are touching the middle of your palm. Hold this position for __________ seconds. Extend your fingers back to the starting position, stretching every joint fully. Repeat  this exercise 5-10 times with each hand. Straight fist Stand or sit with your arm, hand, and all five fingers pointed straight up. Make sure to keep your wrist straight during the exercise. Gently bend your fingers at the big knuckle, where your fingers meet your hand, and the middle knuckle. Keep the knuckle at the tips of your fingers straight and try to touch the bottom of your palm. Hold this position for __________ seconds. Extend your fingers back to the starting position, stretching every joint fully. Repeat this exercise 5-10 times with each hand. Tabletop  Stand or sit with your arm, hand, and all five fingers pointed straight up. Make sure to keep your wrist straight during the exercise. Gently bend your fingers at the big knuckle, where your fingers meet your hand, as far down as you can while keeping the small knuckles in your fingers straight. Think of forming a tabletop with your fingers. Hold this position for __________ seconds. Extend your fingers back to the starting position, stretching every joint fully. Repeat this exercise 5-10 times with each hand. Finger spread  Place your hand flat on a table with your palm facing down. Make sure your wrist stays straight as you do this exercise. Spread your fingers and thumb apart from each other as far as you can until you feel a gentle stretch. Hold this position for __________ seconds. Bring your fingers and thumb tight together again. Hold this position for __________ seconds. Repeat this exercise 5-10 times with each hand. Making circles  Stand or sit with your arm, hand, and all five fingers pointed   straight up. Make sure to keep your wrist straight during the exercise. Make a circle by touching the tip of your thumb to the tip of your index finger. Hold for __________ seconds. Then open your hand wide. Repeat this motion with your thumb and each finger on your hand. Repeat this exercise 5-10 times with each hand. Thumb  motion  Sit with your forearm resting on a table and your wrist straight. Your thumb should be facing up toward the ceiling. Keep your fingers relaxed as you move your thumb. Lift your thumb up as high as you can toward the ceiling. Hold for __________ seconds. Bend your thumb across your palm as far as you can, reaching the tip of your thumb for the small finger (pinkie) side of your palm. Hold for __________ seconds. Repeat this exercise 5-10 times with each hand. Grip strengthening  Hold a stress ball or other soft ball in the middle of your hand. Slowly increase the pressure, squeezing the ball as much as you can without causing pain. Think of bringing the tips of your fingers into the middle of your palm. All of your finger joints should bend when doing this exercise. Hold your squeeze for __________ seconds, then relax. Repeat this exercise 5-10 times with each hand. Contact a health care provider if: Your hand pain or discomfort gets much worse when you do an exercise. Your hand pain or discomfort does not improve within 2 hours after you exercise. If you have any of these problems, stop doing these exercises right away. Do not do them again unless your health care provider says that you can. Get help right away if: You develop sudden, severe hand pain or swelling. If this happens, stop doing these exercises right away. Do not do them again unless your health care provider says that you can. This information is not intended to replace advice given to you by your health care provider. Make sure you discuss any questions you have with your health care provider. Document Revised: 08/22/2020 Document Reviewed: 08/22/2020 Elsevier Patient Education  2023 [employer] Iliotibial Band Syndrome Rehab Ask your health care provider which exercises are safe for you. Do exercises exactly as told by your health care provider and adjust them as directed. It is normal to feel mild stretching,  pulling, tightness, or discomfort as you do these exercises. Stop right away if you feel sudden pain or your pain gets significantly worse. Do not begin these exercises until told by your health care provider. Stretching and range-of-motion exercises These exercises warm up your muscles and joints and improve the movement and flexibility of your hip and pelvis. Quadriceps stretch, prone  Lie on your abdomen (prone position) on a firm surface, such as a bed or padded floor. Bend your left / right knee and reach back to hold your ankle or pant leg. If you cannot reach your ankle or pant leg, loop a belt around your foot and grab the belt instead. Gently pull your heel toward your buttocks. Your knee should not slide out to the side. You should feel a stretch in the front of your thigh and knee (quadriceps). Hold this position for __________ seconds. Repeat __________ times. Complete this exercise __________ times a day. Iliotibial band stretch An iliotibial band is a strong band of muscle tissue that runs from the outer side of your hip to the outer side of your thigh and knee. Lie on your side with your left / right leg in the top   position. Bend both of your knees and grab your left / right ankle. Stretch out your bottom arm to help you balance. Slowly bring your top knee back so your thigh goes behind your trunk. Slowly lower your top leg toward the floor until you feel a gentle stretch on the outside of your left / right hip and thigh. If you do not feel a stretch and your knee will not fall farther, place the heel of your other foot on top of your knee and pull your knee down toward the floor with your foot. Hold this position for __________ seconds. Repeat __________ times. Complete this exercise __________ times a day. Strengthening exercises These exercises build strength and endurance in your hip and pelvis. Endurance is the ability to use your muscles for a long time, even after they get  tired. Straight leg raises, side-lying This exercise strengthens the muscles that rotate the leg at the hip and move it away from your body (hip abductors). Lie on your side with your left / right leg in the top position. Lie so your head, shoulder, hip, and knee line up. You may bend your bottom knee to help you balance. Roll your hips slightly forward so your hips are stacked directly over each other and your left / right knee is facing forward. Tense the muscles in your outer thigh and lift your top leg 4-6 inches (10-15 cm). Hold this position for __________ seconds. Slowly lower your leg to return to the starting position. Let your muscles relax completely before doing another repetition. Repeat __________ times. Complete this exercise __________ times a day. Leg raises, prone This exercise strengthens the muscles that move the hips backward (hip extensors). Lie on your abdomen (prone position) on your bed or a firm surface. You can put a pillow under your hips if that is more comfortable for your lower back. Bend your left / right knee so your foot is straight up in the air. Squeeze your buttocks muscles and lift your left / right thigh off the bed. Do not let your back arch. Tense your thigh muscle as hard as you can without increasing any knee pain. Hold this position for __________ seconds. Slowly lower your leg to return to the starting position and allow it to relax completely. Repeat __________ times. Complete this exercise __________ times a day. Hip hike Stand sideways on a bottom step. Stand on your left / right leg with your other foot unsupported next to the step. You can hold on to a railing or wall for balance if needed. Keep your knees straight and your torso square. Then lift your left / right hip up toward the ceiling. Slowly let your left / right hip lower toward the floor, past the starting position. Your foot should get closer to the floor. Do not lean or bend your  knees. Repeat __________ times. Complete this exercise __________ times a day. This information is not intended to replace advice given to you by your health care provider. Make sure you discuss any questions you have with your health care provider. Document Revised: 07/12/2019 Document Reviewed: 07/12/2019 Elsevier Patient Education  2023 [employer] Inc[employer]  

## 2021-10-02 NOTE — Progress Notes (Signed)
Office Visit Note  Patient: Kathleen Santos             Date of Birth: July 14, 1960           MRN: [id number]             PCP: Jearld Fenton, NP Referring: Jearld Fenton, NP Visit Date: 10/02/2021 Occupation: @GUAROCC @  Subjective:  Other (Left hip pain, patient requested injection. )   History of Present Illness: Kathleen Santos is a 61 y.o. female with history of osteoarthritis.  She states she continues to have some stiffness in her hands and her knee joints.  She complains of pain and discomfort in her bilateral trochanteric bursa.  The left trochanteric bursa is more painful.  She states the pain from the left trochanteric bursa radiates into the left IT band.  She has been experiencing nocturnal pain.  She is planning to get a new mattress.  Activities of Daily Living:  Patient reports morning stiffness for 5-10 minutes.   Patient Reports nocturnal pain.  Difficulty dressing/grooming: Denies Difficulty climbing stairs: Denies Difficulty getting out of chair: Denies Difficulty using hands for taps, buttons, cutlery, and/or writing: Reports  Review of Systems  Constitutional:  Negative for fatigue.  HENT:  Negative for mouth sores, mouth dryness and nose dryness.   Eyes:  Negative for pain, itching and dryness.  Respiratory:  Negative for shortness of breath and difficulty breathing.   Cardiovascular:  Negative for chest pain and palpitations.  Gastrointestinal:  Negative for blood in stool, constipation and diarrhea.  Endocrine: Negative for increased urination.  Genitourinary:  Negative for difficulty urinating.  Musculoskeletal:  Positive for joint pain, joint pain, myalgias, morning stiffness, muscle tenderness and myalgias. Negative for joint swelling.  Skin:  Negative for color change, rash and redness.  Allergic/Immunologic: Negative for susceptible to infections.  Neurological:  Negative for dizziness, numbness, headaches, memory loss and weakness.   Hematological:  Negative for bruising/bleeding tendency.  Psychiatric/Behavioral:  Negative for confusion.    PMFS History:  Patient Active Problem List   Diagnosis Date Noted   Primary osteoarthritis of both knees 10/02/2021   Primary osteoarthritis of both hands 10/02/2021   Hot flashes 05/20/2016   Migraines 07/05/2015    Past Medical History:  Diagnosis Date   Allergy    Asthma    Childhood   Migraine     Family History  Problem Relation Age of Onset   Stroke Mother 56   Osteoarthritis Mother    Diabetes Mother    Heart disease Mother    Arthritis Mother    COPD Father    Throat cancer Father    Cancer Father    Heart disease Father    Cancer Sister        Breast   Breast cancer Sister        Early 18's   Cancer Brother        Prostate   Cancer Sister        Breast   Breast cancer Sister        Early 24's   Charcot-Marie-Tooth disease Sister    Past Surgical History:  Procedure Laterality Date   ABDOMINAL HYSTERECTOMY  2000   Social History   Social History Narrative   Married   1 year of college   [facility]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  Constitutional:      Appearance: She is well-developed.  HENT:     Head: Normocephalic and atraumatic.  Eyes:     Conjunctiva/sclera: Conjunctivae normal.  Cardiovascular:     Rate and Rhythm: Normal rate and regular rhythm.     Heart sounds: Normal heart sounds.  Pulmonary:     Effort: Pulmonary effort is normal.     Breath  sounds: Normal breath sounds.  Abdominal:     General: Bowel sounds are normal.     Palpations: Abdomen is soft.  Musculoskeletal:     Cervical back: Normal range of motion.  Lymphadenopathy:     Cervical: No cervical adenopathy.  Skin:    General: Skin is warm and dry.     Capillary Refill: Capillary refill takes less than 2 seconds.  Neurological:     Mental Status: She is alert and oriented to person, place, and time.  Psychiatric:        Behavior: Behavior normal.     Musculoskeletal Exam: C-spine was in good range of motion.  Shoulder joints, elbow joints, wrist joints, MCPs PIPs and DIPs and good range of motion.  She had bilateral PIP and DIP thickening.  Hip joints and knee joints with good range of motion.  She had tenderness over left trochanteric bursa consistent with trochanteric bursitis.  There was no tenderness over ankles or MTPs.  CDAI Exam: CDAI Score: -- Patient Global: --; Provider Global: -- Swollen: --; Tender: -- Joint Exam 10/02/2021   No joint exam has been documented for this visit   There is currently no information documented on the homunculus. Go to the Rheumatology activity and complete the homunculus joint exam.  Investigation: No additional findings.  Imaging: No results found.  Recent Labs: Lab Results  Component Value Date   WBC 7.0 08/14/2019   HGB 13.2 08/14/2019   PLT 309.0 08/14/2019   NA 138 08/14/2019   K 3.9 08/14/2019   CL 102 08/14/2019   CO2 29 08/14/2019   GLUCOSE 75 08/14/2019   BUN 15 08/14/2019   CREATININE 0.86 08/14/2019   BILITOT 0.3 08/14/2019   ALKPHOS 65 08/14/2019   AST 24 08/14/2019   ALT 17 08/14/2019   PROT 7.0 08/14/2019   ALBUMIN 4.1 08/14/2019   CALCIUM 9.4 08/14/2019    Speciality Comments: No specialty comments available.  Procedures:  Large Joint Inj: L greater trochanter on 10/02/2021 11:38 AM Indications: pain Details: 27 G 1.5 in needle, lateral approach  Arthrogram: No  Medications:  1.5 mL lidocaine 1 %; 40 mg triamcinolone acetonide 40 MG/ML Aspirate: 0 mL Procedure, treatment alternatives, risks and benefits explained, specific risks discussed. Immediately prior to procedure a time out was called to verify the correct patient, procedure, equipment, support staff and site/side marked as required. Patient was prepped and draped in the usual sterile fashion.    Allergies: Patient has no known allergies.   Assessment / Plan:     Visit Diagnoses: Trochanteric bursitis, left hip -she has been experiencing pain and discomfort in her left trochanteric bursa.  She complains of  nocturnal pain and discomfort walking.  She had good response to cortisone injection in the past.  Left trochanteric bursa injection on August 09, 2020.  Per patient's request left trochanteric area was injected with lidocaine and cortisone as described above.  She tolerated the procedure well.  Postprocedure instructions were given.  I also demonstrated some of the exercises in the office.  A handout on exercises was given.  Chronic pain of both hips -she has off-and-on discomfort in her hip joints.  X-rays of bilateral hip joints were unremarkable in the past.  Primary osteoarthritis of both knees -she notices some discomfort in her knee joints when she is on the treadmill.  Quad muscle strengthening exercises were discussed.  Mild osteoarthritis and moderate chondromalacia patella was noted on previous x-rays.  Primary osteoarthritis of both hands-she has bilateral PIP and DIP thickening with some discomfort.  Hand exercises were emphasized.  A handout on hand exercises was given. Hx of migraines  Orders: Orders Placed This Encounter  Procedures   Large Joint Inj   No orders of the defined types were placed in this encounter.  .  Follow-Up Instructions: Return in about 6 months (around 04/04/2022) for Osteoarthritis.   Bo Merino, MD  Note - This record has been created using Radio producer.  Chart creation errors have been sought, but may not always  have been located. Such creation errors do not reflect on  the standard of medical care.

## 2022-03-24 NOTE — Progress Notes (Signed)
Office Visit Note  Patient: Kathleen Santos             Date of Birth: Jan 16, 1961           MRN: [id number]             PCP: Jearld Fenton, NP Referring: Jearld Fenton, NP Visit Date: 04/07/2022 Occupation: @GUAROCC @  Subjective:  Left hip pain  History of Present Illness: Kathleen Santos is a 61 y.o. female with history of osteoarthritis.  She states for the last few weeks she has been having pain and discomfort in the left trochanteric region.  She has nocturnal pain and also difficulty walking after prolonged sitting.  The symptoms improved after stretching.  She has been doing stretching exercises.  She continues to have pain and discomfort in her bilateral hands.  She has off-and-on discomfort in her knee joints.  She has not noticed any joint swelling.  She has not been exercising as much.  Activities of Daily Living:  Patient reports morning stiffness for 10-15 minutes.   Patient Reports nocturnal pain.  Difficulty dressing/grooming: Denies Difficulty climbing stairs: Denies Difficulty getting out of chair: Denies Difficulty using hands for taps, buttons, cutlery, and/or writing: Denies  Review of Systems  Constitutional:  Negative for fatigue.  HENT:  Negative for mouth sores and mouth dryness.   Eyes:  Negative for dryness.  Respiratory:  Negative for shortness of breath.   Cardiovascular:  Negative for chest pain and palpitations.  Gastrointestinal:  Negative for blood in stool, constipation and diarrhea.  Endocrine: Negative for increased urination.  Genitourinary:  Negative for involuntary urination.  Musculoskeletal:  Positive for joint pain, joint pain, myalgias, morning stiffness and myalgias. Negative for gait problem, joint swelling, muscle weakness and muscle tenderness.  Skin:  Negative for color change, rash, hair loss and sensitivity to sunlight.  Allergic/Immunologic: Negative for susceptible to infections.  Neurological:  Negative for dizziness  and headaches.  Hematological:  Negative for swollen glands.  Psychiatric/Behavioral:  Positive for sleep disturbance. Negative for depressed mood. The patient is not nervous/anxious.     PMFS History:  Patient Active Problem List   Diagnosis Date Noted   Primary osteoarthritis of both knees 10/02/2021   Primary osteoarthritis of both hands 10/02/2021   Hot flashes 05/20/2016   Migraines 07/05/2015    Past Medical History:  Diagnosis Date   Allergy    Asthma    Childhood   Migraine     Family History  Problem Relation Age of Onset   Stroke Mother 67   Osteoarthritis Mother    Diabetes Mother    Heart disease Mother    Arthritis Mother    COPD Father    Throat cancer Father    Cancer Father    Heart disease Father    Cancer Sister        Breast   Breast cancer Sister        Early 75's   Cancer Brother        Prostate   Cancer Sister        Breast   Breast cancer Sister        Early 11's   Charcot-Marie-Tooth disease Sister    Past Surgical History:  Procedure Laterality Date   ABDOMINAL HYSTERECTOMY  2000   Social History   Social History Narrative   Married   1 year of college   [employer] healthcare- Surgery scheduler    No Children  Caffeine- coffee 2 cups, tea prn   Counseling- 17 years ago after mother passing    Immunization History  Administered Date(s) Administered   Influenza,inj,Quad PF,6+ Mos 03/18/2016, 03/12/2017, 03/24/2018, 01/25/2019   Tdap 05/28/2014     Objective: Vital Signs: BP 111/76 (BP Location: Left Arm, Patient Position: Sitting, Cuff Size: Normal)   Pulse 68   Resp 16   Ht 5\' 6"  (1.676 m)   Wt 162 lb (73.5 kg)   BMI 26.15 kg/m    Physical Exam Vitals and nursing note reviewed.  Constitutional:      Appearance: She is well-developed.  HENT:     Head: Normocephalic and atraumatic.  Eyes:     Conjunctiva/sclera: Conjunctivae normal.  Cardiovascular:     Rate and Rhythm: Normal rate and regular rhythm.     Heart sounds:  Normal heart sounds.  Pulmonary:     Effort: Pulmonary effort is normal.     Breath sounds: Normal breath sounds.  Abdominal:     General: Bowel sounds are normal.     Palpations: Abdomen is soft.  Musculoskeletal:     Cervical back: Normal range of motion.  Lymphadenopathy:     Cervical: No cervical adenopathy.  Skin:    General: Skin is warm and dry.     Capillary Refill: Capillary refill takes less than 2 seconds.  Neurological:     Mental Status: She is alert and oriented to person, place, and time.  Psychiatric:        Behavior: Behavior normal.      Musculoskeletal Exam: Cervical, thoracic and lumbar spine were in good range of motion without tenderness.  Shoulder joints, elbow joints, wrist joints, MCPs PIPs and DIPs Juengel range of motion.  She had bilateral PIP and DIP thickening with no synovitis.  Hip joints with good range of motion.  She had tenderness over left trochanteric bursa consistent with trochanteric bursitis.  Knee joints with good range of motion without any warmth swelling or effusion.  There was no tenderness over ankles or MTPs.  CDAI Exam: CDAI Score: -- Patient Global: --; Provider Global: -- Swollen: --; Tender: -- Joint Exam 04/07/2022   No joint exam has been documented for this visit   There is currently no information documented on the homunculus. Go to the Rheumatology activity and complete the homunculus joint exam.  Investigation: No additional findings.  Imaging: No results found.  Recent Labs: Lab Results  Component Value Date   WBC 7.0 08/14/2019   HGB 13.2 08/14/2019   PLT 309.0 08/14/2019   NA 138 08/14/2019   K 3.9 08/14/2019   CL 102 08/14/2019   CO2 29 08/14/2019   GLUCOSE 75 08/14/2019   BUN 15 08/14/2019   CREATININE 0.86 08/14/2019   BILITOT 0.3 08/14/2019   ALKPHOS 65 08/14/2019   AST 24 08/14/2019   ALT 17 08/14/2019   PROT 7.0 08/14/2019   ALBUMIN 4.1 08/14/2019   CALCIUM 9.4 08/14/2019    Speciality  Comments: No specialty comments available.  Procedures:  Large Joint Inj: L greater trochanter on 04/07/2022 11:43 AM Indications: pain Details: 27 G 1.5 in needle, lateral approach  Arthrogram: No  Medications: 40 mg triamcinolone acetonide 40 MG/ML; 1.5 mL lidocaine 1 % Aspirate: 0 mL Outcome: tolerated well, no immediate complications Procedure, treatment alternatives, risks and benefits explained, specific risks discussed. Consent was given by the patient. Immediately prior to procedure a time out was called to verify the correct patient, procedure, equipment, support staff and  site/side marked as required. Patient was prepped and draped in the usual sterile fashion.     Allergies: Patient has no known allergies.   Assessment / Plan:     Visit Diagnoses: Trochanteric bursitis, left hip-she has been experiencing pain and discomfort over the left trochanteric bursa consistent with trochanteric bursitis.  She requested cortisone injection.  She has been doing IT band stretches without much relief.  After informed consent was obtained and side effects were discussed left trochanteric bursa was injected with lidocaine and Kenalog as described above.  She tolerated the procedure well.  Postprocedure instructions were given.  She was advised to continue with the stretches.  She was advised to contact us if her symptoms do not improve.  Chronic pain of both hips - X-rays of bilateral hip joints were unremarkable in the past.  Primary osteoarthritis of both knees-she has mild osteoarthritis and moderate chondromalacia patella.  She continues to have intermittent discomfort in her knee joints.  Lower extremity muscle strengthening exercises were advised.  Primary osteoarthritis of both hands-she had bilateral PIP and DIP thickening with no synovitis.  Joint protection muscle strengthening was discussed.  Exercises were demonstrated in the office and also a handout on hand exercises was  given.  Orders: Orders Placed This Encounter  Procedures   Large Joint Inj   No orders of the defined types were placed in this encounter.   Follow-Up Instructions: Return in about 1 year (around 04/08/2023) for Osteoarthritis.   Pollyann Savoy, MD  Note - This record has been created using [employer].  Chart creation errors have been sought, but may not always  have been located. Such creation errors do not reflect on  the standard of medical care.

## 2022-04-07 ENCOUNTER — Ambulatory Visit: Payer: BC Managed Care – PPO | Attending: Rheumatology | Admitting: Rheumatology

## 2022-04-07 ENCOUNTER — Encounter: Payer: Self-pay | Admitting: Rheumatology

## 2022-04-07 VITALS — BP 111/76 | HR 68 | Resp 16 | Ht 66.0 in | Wt 162.0 lb

## 2022-04-07 DIAGNOSIS — M19041 Primary osteoarthritis, right hand: Secondary | ICD-10-CM | POA: Diagnosis not present

## 2022-04-07 DIAGNOSIS — M170 Bilateral primary osteoarthritis of knee: Secondary | ICD-10-CM

## 2022-04-07 DIAGNOSIS — G8929 Other chronic pain: Secondary | ICD-10-CM

## 2022-04-07 DIAGNOSIS — M7062 Trochanteric bursitis, left hip: Secondary | ICD-10-CM | POA: Diagnosis not present

## 2022-04-07 DIAGNOSIS — M25552 Pain in left hip: Secondary | ICD-10-CM

## 2022-04-07 DIAGNOSIS — M19042 Primary osteoarthritis, left hand: Secondary | ICD-10-CM

## 2022-04-07 DIAGNOSIS — M25551 Pain in right hip: Secondary | ICD-10-CM | POA: Diagnosis not present

## 2022-04-07 MED ORDER — TRIAMCINOLONE ACETONIDE 40 MG/ML IJ SUSP
40.0000 mg | INTRAMUSCULAR | Status: AC | PRN
  Administered 2022-04-07: 40 mg via INTRA_ARTICULAR

## 2022-04-07 MED ORDER — LIDOCAINE HCL 1 % IJ SOLN
1.5000 mL | INTRAMUSCULAR | Status: AC | PRN
Start: 2022-04-07 — End: 2022-04-07
  Administered 2022-04-07: 1.5 mL

## 2022-04-07 NOTE — Patient Instructions (Signed)
I examined and evaluated the patient with Kathleen Ales PA. The plan of care was discussed as noted above.  Kathleen Savoy, MD Hand Exercises Hand exercises can be helpful for almost anyone. These exercises can strengthen the hands, improve flexibility and movement, and increase blood flow to the hands. These results can make work and daily tasks easier. Hand exercises can be especially helpful for people who have joint pain from arthritis or have nerve damage from overuse (carpal tunnel syndrome). These exercises can also help people who have injured a hand. Exercises Most of these hand exercises are gentle stretching and motion exercises. It is usually safe to do them often throughout the day. Warming up your hands before exercise may help to reduce stiffness. You can do this with gentle massage or by placing your hands in warm water for 10-15 minutes. It is normal to feel some stretching, pulling, tightness, or mild discomfort as you begin new exercises. This will gradually improve. Stop an exercise right away if you feel sudden, severe pain or your pain gets worse. Ask your health care provider which exercises are best for you. Knuckle bend or "claw" fist  Stand or sit with your arm, hand, and all five fingers pointed straight up. Make sure to keep your wrist straight during the exercise. Gently bend your fingers down toward your palm until the tips of your fingers are touching the top of your palm. Keep your big knuckle straight and just bend the small knuckles in your fingers. Hold this position for __________ seconds. Straighten (extend) your fingers back to the starting position. Repeat this exercise 5-10 times with each hand. Full finger fist  Stand or sit with your arm, hand, and all five fingers pointed straight up. Make sure to keep your wrist straight during the exercise. Gently bend your fingers into your palm until the tips of your fingers are touching the middle of your  palm. Hold this position for __________ seconds. Extend your fingers back to the starting position, stretching every joint fully. Repeat this exercise 5-10 times with each hand. Straight fist Stand or sit with your arm, hand, and all five fingers pointed straight up. Make sure to keep your wrist straight during the exercise. Gently bend your fingers at the big knuckle, where your fingers meet your hand, and the middle knuckle. Keep the knuckle at the tips of your fingers straight and try to touch the bottom of your palm. Hold this position for __________ seconds. Extend your fingers back to the starting position, stretching every joint fully. Repeat this exercise 5-10 times with each hand. Tabletop  Stand or sit with your arm, hand, and all five fingers pointed straight up. Make sure to keep your wrist straight during the exercise. Gently bend your fingers at the big knuckle, where your fingers meet your hand, as far down as you can while keeping the small knuckles in your fingers straight. Think of forming a tabletop with your fingers. Hold this position for __________ seconds. Extend your fingers back to the starting position, stretching every joint fully. Repeat this exercise 5-10 times with each hand. Finger spread  Place your hand flat on a table with your palm facing down. Make sure your wrist stays straight as you do this exercise. Spread your fingers and thumb apart from each other as far as you can until you feel a gentle stretch. Hold this position for __________ seconds. Bring your fingers and thumb tight together again. Hold this position for __________ seconds.  Repeat this exercise 5-10 times with each hand. Making circles  Stand or sit with your arm, hand, and all five fingers pointed straight up. Make sure to keep your wrist straight during the exercise. Make a circle by touching the tip of your thumb to the tip of your index finger. Hold for __________ seconds. Then open  your hand wide. Repeat this motion with your thumb and each finger on your hand. Repeat this exercise 5-10 times with each hand. Thumb motion  Sit with your forearm resting on a table and your wrist straight. Your thumb should be facing up toward the ceiling. Keep your fingers relaxed as you move your thumb. Lift your thumb up as high as you can toward the ceiling. Hold for __________ seconds. Bend your thumb across your palm as far as you can, reaching the tip of your thumb for the small finger (pinkie) side of your palm. Hold for __________ seconds. Repeat this exercise 5-10 times with each hand. Grip strengthening  Hold a stress ball or other soft ball in the middle of your hand. Slowly increase the pressure, squeezing the ball as much as you can without causing pain. Think of bringing the tips of your fingers into the middle of your palm. All of your finger joints should bend when doing this exercise. Hold your squeeze for __________ seconds, then relax. Repeat this exercise 5-10 times with each hand. Contact a health care provider if: Your hand pain or discomfort gets much worse when you do an exercise. Your hand pain or discomfort does not improve within 2 hours after you exercise. If you have any of these problems, stop doing these exercises right away. Do not do them again unless your health care provider says that you can. Get help right away if: You develop sudden, severe hand pain or swelling. If this happens, stop doing these exercises right away. Do not do them again unless your health care provider says that you can. This information is not intended to replace advice given to you by your health care provider. Make sure you discuss any questions you have with your health care provider. Document Revised: 08/22/2020 Document Reviewed: 08/22/2020 Elsevier Patient Education  2023 [employer].

## 2022-05-26 ENCOUNTER — Other Ambulatory Visit: Payer: Self-pay | Admitting: Family Medicine

## 2022-05-26 DIAGNOSIS — Z1231 Encounter for screening mammogram for malignant neoplasm of breast: Secondary | ICD-10-CM

## 2022-08-11 ENCOUNTER — Ambulatory Visit
Admission: RE | Admit: 2022-08-11 | Discharge: 2022-08-11 | Disposition: A | Discharge status: Home / Self Care | Payer: BC Managed Care – PPO | Source: Ambulatory Visit | Attending: Family Medicine | Admitting: Family Medicine

## 2022-08-11 DIAGNOSIS — Z1231 Encounter for screening mammogram for malignant neoplasm of breast: Secondary | ICD-10-CM

## 2023-03-23 NOTE — Progress Notes (Deleted)
Office Visit Note  Patient: Kathleen Santos             Date of Birth: 1960/07/03           MRN: [id number]             PCP: Aliene Beams, MD Referring: Lorre Munroe, NP Visit Date: 04/06/2023 Occupation: @GUAROCC @  Subjective:  No chief complaint on file.   History of Present Illness: Kathleen Santos is a 62 y.o. female ***     Activities of Daily Living:  Patient reports morning stiffness for *** {minute/hour:19697}.   Patient {ACTIONS;DENIES/REPORTS:21021675::"Denies"} nocturnal pain.  Difficulty dressing/grooming: {ACTIONS;DENIES/REPORTS:21021675::"Denies"} Difficulty climbing stairs: {ACTIONS;DENIES/REPORTS:21021675::"Denies"} Difficulty getting out of chair: {ACTIONS;DENIES/REPORTS:21021675::"Denies"} Difficulty using hands for taps, buttons, cutlery, and/or writing: {ACTIONS;DENIES/REPORTS:21021675::"Denies"}  No Rheumatology ROS completed.   PMFS History:  Patient Active Problem List   Diagnosis Date Noted   Primary osteoarthritis of both knees 10/02/2021   Primary osteoarthritis of both hands 10/02/2021   Hot flashes 05/20/2016   Migraines 07/05/2015    Past Medical History:  Diagnosis Date   Allergy    Asthma    Childhood   Migraine     Family History  Problem Relation Age of Onset   Stroke Mother 82   Osteoarthritis Mother    Diabetes Mother    Heart disease Mother    Arthritis Mother    COPD Father    Throat cancer Father    Cancer Father    Heart disease Father    Cancer Sister        Breast   Breast cancer Sister        Early 91's   Cancer Brother        Prostate   Cancer Sister        Breast   Breast cancer Sister        Early 40's   Charcot-Marie-Tooth disease Sister    Past Surgical History:  Procedure Laterality Date   ABDOMINAL HYSTERECTOMY  2000   Social History   Social History Narrative   Married   1 year of college   [employer]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   Physical Exam   Musculoskeletal Exam: ***  CDAI Exam: CDAI Score: -- Patient Global: --; Provider Global: -- Swollen: --; Tender: -- Joint Exam 04/06/2023   No joint exam has been documented for this visit   There is currently no information documented on the homunculus. Go to the Rheumatology activity and complete the homunculus joint exam.  Investigation: No additional findings.  Imaging: No results found.  Recent Labs: Lab Results  Component Value Date   WBC 7.0 08/14/2019   HGB 13.2 08/14/2019   PLT 309.0 08/14/2019   NA 138 08/14/2019   K 3.9 08/14/2019   CL 102 08/14/2019   CO2 29 08/14/2019   GLUCOSE 75 08/14/2019   BUN 15 08/14/2019   CREATININE 0.86 08/14/2019   BILITOT 0.3 08/14/2019   ALKPHOS 65 08/14/2019   AST 24 08/14/2019   ALT 17 08/14/2019   PROT 7.0 08/14/2019   ALBUMIN 4.1 08/14/2019   CALCIUM 9.4 08/14/2019    Speciality Comments: No  specialty comments available.  Procedures:  No procedures performed Allergies: Patient has no known allergies.   Assessment / Plan:     Visit Diagnoses: Trochanteric bursitis, left hip - Injected April 07, 2022  Chronic pain of both hips  Primary osteoarthritis of both knees  Primary osteoarthritis of both hands  Hx of migraines  Orders: No orders of the defined types were placed in this encounter.  No orders of the defined types were placed in this encounter.   Face-to-face time spent with patient was *** minutes. Greater than 50% of time was spent in counseling and coordination of care.  Follow-Up Instructions: No follow-ups on file.   Pollyann Savoy, MD  Note - This record has been created using [employer].  Chart creation errors have been sought, but may not always  have been located. Such creation errors do not reflect on  the standard of medical care.

## 2023-03-30 NOTE — Progress Notes (Signed)
Office Visit Note  Patient: Kathleen Santos             Date of Birth: 1960-06-19           MRN: [id number]             PCP: Aliene Beams, MD Referring: Lorre Munroe, NP Visit Date: 04/13/2023 Occupation: @GUAROCC @  Subjective:  Hip pain   History of Present Illness: Kathleen Santos is a 62 y.o. female with osteoarthritis.  She returns today after her last visit in November 2023.  Patient states she started experiencing left hip pain which lasted for few days and then resolved by itself.  She has noticed more knots on her hands.  She has been doing exercises for her hands and also for the IT band.  She is occasional discomfort in her knee joints.  She has not noticed any joint swelling.    Activities of Daily Living:  Patient reports morning stiffness for 0 minute.   Patient Denies nocturnal pain.  Difficulty dressing/grooming: Denies Difficulty climbing stairs: Denies Difficulty getting out of chair: Denies Difficulty using hands for taps, buttons, cutlery, and/or writing: Denies  Review of Systems  Constitutional:  Negative for fatigue.  HENT:  Negative for mouth sores and mouth dryness.   Eyes:  Negative for dryness.  Respiratory:  Negative for shortness of breath.   Cardiovascular:  Negative for chest pain and palpitations.  Gastrointestinal:  Negative for blood in stool, constipation and diarrhea.  Endocrine: Negative for increased urination.  Genitourinary:  Negative for involuntary urination.  Musculoskeletal:  Positive for joint pain and joint pain. Negative for gait problem, joint swelling, myalgias, muscle weakness, morning stiffness, muscle tenderness and myalgias.  Skin:  Negative for color change, rash, hair loss and sensitivity to sunlight.  Allergic/Immunologic: Negative for susceptible to infections.  Neurological:  Negative for dizziness and headaches.  Hematological:  Negative for swollen glands.  Psychiatric/Behavioral:  Negative for depressed  mood and sleep disturbance. The patient is not nervous/anxious.     PMFS History:  Patient Active Problem List   Diagnosis Date Noted   Primary osteoarthritis of both knees 10/02/2021   Primary osteoarthritis of both hands 10/02/2021   Hot flashes 05/20/2016   Migraines 07/05/2015    Past Medical History:  Diagnosis Date   Allergy    Asthma    Childhood   Migraine     Family History  Problem Relation Age of Onset   Stroke Mother 26   Osteoarthritis Mother    Diabetes Mother    Heart disease Mother    Arthritis Mother    COPD Father    Throat cancer Father    Cancer Father    Heart disease Father    Cancer Sister        Breast   Breast cancer Sister        Early 37's   Cancer Brother        Prostate   Cancer Sister        Breast   Breast cancer Sister        Early 36's   Charcot-Marie-Tooth disease Sister    Past Surgical History:  Procedure Laterality Date   ABDOMINAL HYSTERECTOMY  2000   Social History   Social History Narrative   Married   1 year of college   [employer] healthcare- Surgery scheduler    No Children    Caffeine- coffee 2 cups, tea prn   Counseling- 17 years ago after  mother passing    Immunization History  Administered Date(s) Administered   Influenza,inj,Quad PF,6+ Mos 03/18/2016, 03/12/2017, 03/24/2018, 01/25/2019   Tdap 05/28/2014     Objective: Vital Signs: BP 107/72 (BP Location: Left Arm, Patient Position: Sitting, Cuff Size: Normal)   Pulse 69   Resp 14   Ht 5\' 6"  (1.676 m)   Wt 161 lb (73 kg)   BMI 25.99 kg/m    Physical Exam Vitals and nursing note reviewed.  Constitutional:      Appearance: She is well-developed.  HENT:     Head: Normocephalic and atraumatic.  Eyes:     Conjunctiva/sclera: Conjunctivae normal.  Cardiovascular:     Rate and Rhythm: Normal rate and regular rhythm.     Heart sounds: Normal heart sounds.  Pulmonary:     Effort: Pulmonary effort is normal.     Breath sounds: Normal breath sounds.   Abdominal:     General: Bowel sounds are normal.     Palpations: Abdomen is soft.  Musculoskeletal:     Cervical back: Normal range of motion.  Lymphadenopathy:     Cervical: No cervical adenopathy.  Skin:    General: Skin is warm and dry.     Capillary Refill: Capillary refill takes less than 2 seconds.  Neurological:     Mental Status: She is alert and oriented to person, place, and time.  Psychiatric:        Behavior: Behavior normal.      Musculoskeletal Exam: Cervical, thoracic and lumbar spine 1 good range of motion.  Shoulder joints, elbow joints, wrist joints, MCPs PIPs and DIPs with good range of motion.  She had bilateral DIP thickening.  She had mild tenderness over left trochanteric bursa.  Hip joints and knee joints were in good range of motion without any warmth swelling or effusion.  There was no tenderness over ankles or MTPs.  No synovitis was noted on the examination.  CDAI Exam: CDAI Score: -- Patient Global: --; Provider Global: -- Swollen: --; Tender: -- Joint Exam 04/13/2023   No joint exam has been documented for this visit   There is currently no information documented on the homunculus. Go to the Rheumatology activity and complete the homunculus joint exam.  Investigation: No additional findings.  Imaging: No results found.  Recent Labs: Lab Results  Component Value Date   WBC 7.0 08/14/2019   HGB 13.2 08/14/2019   PLT 309.0 08/14/2019   NA 138 08/14/2019   K 3.9 08/14/2019   CL 102 08/14/2019   CO2 29 08/14/2019   GLUCOSE 75 08/14/2019   BUN 15 08/14/2019   CREATININE 0.86 08/14/2019   BILITOT 0.3 08/14/2019   ALKPHOS 65 08/14/2019   AST 24 08/14/2019   ALT 17 08/14/2019   PROT 7.0 08/14/2019   ALBUMIN 4.1 08/14/2019   CALCIUM 9.4 08/14/2019    Speciality Comments: No specialty comments available.  Procedures:  No procedures performed Allergies: Patient has no known allergies.   Assessment / Plan:     Visit Diagnoses:  Trochanteric bursitis, left hip-patient had a flare of left trochanteric bursitis recently which resolved by itself.  She has been doing IT band stretches.  I advised her to call us back if her symptoms get worse to get a cortisone injection.  At this point she will continue with Teah IT band stretches.  Chronic pain of both hips -she is concerned about the intermittent hip pain.  X-rays of bilateral hip joints were unremarkable in the past.  Previous x-rays from August 2020 were reviewed and discussed.  Primary osteoarthritis of both knees -she has not mitten discomfort in her knee joints.  She has mild osteoarthritis and moderate chondromalacia patella on the x-rays from 2020.  Lower extremity muscle strengthening exercises were discussed.  Primary osteoarthritis of both hands-she continues to have DIP thickening.  Joint protection muscle strengthening was discussed.  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  Orders: No orders of the defined types were placed in this encounter.  No orders of the defined types were placed in this encounter.   Follow-Up Instructions: Return in about 1 year (around 04/12/2024) for Osteoarthritis.   Pollyann Savoy, MD  Note - This record has been created using Animal nutritionist.  Chart creation errors have been sought, but may not always  have been located. Such creation errors do not reflect on  the standard of medical care.

## 2023-04-06 ENCOUNTER — Ambulatory Visit: Payer: BC Managed Care – PPO | Admitting: Rheumatology

## 2023-04-06 DIAGNOSIS — M170 Bilateral primary osteoarthritis of knee: Secondary | ICD-10-CM

## 2023-04-06 DIAGNOSIS — M19041 Primary osteoarthritis, right hand: Secondary | ICD-10-CM

## 2023-04-06 DIAGNOSIS — G8929 Other chronic pain: Secondary | ICD-10-CM

## 2023-04-06 DIAGNOSIS — M7062 Trochanteric bursitis, left hip: Secondary | ICD-10-CM

## 2023-04-06 DIAGNOSIS — Z8669 Personal history of other diseases of the nervous system and sense organs: Secondary | ICD-10-CM

## 2023-04-13 ENCOUNTER — Encounter: Payer: Self-pay | Admitting: Rheumatology

## 2023-04-13 ENCOUNTER — Ambulatory Visit: Payer: BC Managed Care – PPO | Attending: Rheumatology | Admitting: Rheumatology

## 2023-04-13 VITALS — BP 107/72 | HR 69 | Resp 14 | Ht 66.0 in | Wt 161.0 lb

## 2023-04-13 DIAGNOSIS — M25552 Pain in left hip: Secondary | ICD-10-CM

## 2023-04-13 DIAGNOSIS — M170 Bilateral primary osteoarthritis of knee: Secondary | ICD-10-CM | POA: Diagnosis not present

## 2023-04-13 DIAGNOSIS — G8929 Other chronic pain: Secondary | ICD-10-CM

## 2023-04-13 DIAGNOSIS — M19042 Primary osteoarthritis, left hand: Secondary | ICD-10-CM

## 2023-04-13 DIAGNOSIS — M7062 Trochanteric bursitis, left hip: Secondary | ICD-10-CM | POA: Diagnosis not present

## 2023-04-13 DIAGNOSIS — M25551 Pain in right hip: Secondary | ICD-10-CM

## 2023-04-13 DIAGNOSIS — M19041 Primary osteoarthritis, right hand: Secondary | ICD-10-CM

## 2023-06-29 ENCOUNTER — Other Ambulatory Visit: Payer: Self-pay | Admitting: Family Medicine

## 2023-06-29 DIAGNOSIS — Z1231 Encounter for screening mammogram for malignant neoplasm of breast: Secondary | ICD-10-CM

## 2023-08-12 ENCOUNTER — Ambulatory Visit: Payer: Self-pay

## 2023-08-19 ENCOUNTER — Ambulatory Visit
Admission: RE | Admit: 2023-08-19 | Discharge: 2023-08-19 | Disposition: A | Discharge status: Home / Self Care | Source: Ambulatory Visit | Attending: Family Medicine | Admitting: Family Medicine

## 2023-08-19 DIAGNOSIS — Z1231 Encounter for screening mammogram for malignant neoplasm of breast: Secondary | ICD-10-CM

## 2024-04-12 ENCOUNTER — Ambulatory Visit: Payer: BC Managed Care – PPO | Admitting: Rheumatology

## 2024-04-17 NOTE — Progress Notes (Signed)
 Office Visit Note  Patient: Kathleen Santos             Date of Birth: [birth year]           MRN: [id number]             PCP: Rolinda Millman, MD Referring: Rolinda Millman, MD Visit Date: 04/27/2024 Occupation: Data Unavailable  Subjective:  Left hip and knee pain  History of Present Illness: Kathleen Santos is a 63 y.o. female with osteoarthritis.  She returns today after her last visit in November 2024.  She has been having increased pain and discomfort in the left trochanteric region.  She states it gets worse when she is sleeping at night.  The pain radiates into her left knee and then into her shin.  She has been also having some left sided midthoracic pain.  She states she went to a chiropractor and had some manipulation.  She states that she has been experiencing sharp pain intermittently in that region.  None of the other joints are painful or swollen.    Activities of Daily Living:  Patient reports morning stiffness for 30 minutes.   Patient Reports nocturnal pain.  Difficulty dressing/grooming: Denies Difficulty climbing stairs: Denies Difficulty getting out of chair: Denies Difficulty using hands for taps, buttons, cutlery, and/or writing: Denies  Review of Systems  Constitutional:  Negative for fatigue.  HENT:  Negative for mouth sores and mouth dryness.   Eyes:  Negative for dryness.  Respiratory:  Negative for shortness of breath.   Cardiovascular:  Negative for chest pain and palpitations.  Gastrointestinal:  Negative for blood in stool, constipation and diarrhea.  Endocrine: Negative for increased urination.  Genitourinary:  Negative for involuntary urination.  Musculoskeletal:  Positive for joint pain, joint pain, myalgias, morning stiffness, muscle tenderness and myalgias. Negative for gait problem, joint swelling and muscle weakness.  Skin:  Negative for color change, rash, hair loss and sensitivity to sunlight.  Allergic/Immunologic: Negative for  susceptible to infections.  Neurological:  Negative for dizziness and headaches.  Hematological:  Negative for swollen glands.  Psychiatric/Behavioral:  Positive for sleep disturbance. Negative for depressed mood. The patient is not nervous/anxious.     PMFS History:  Patient Active Problem List   Diagnosis Date Noted   Primary osteoarthritis of both knees 10/02/2021   Primary osteoarthritis of both hands 10/02/2021   Hot flashes 05/20/2016   Migraines 07/05/2015    Past Medical History:  Diagnosis Date   Allergy    Asthma    Childhood   Migraine     Family History  Problem Relation Age of Onset   Stroke Mother 60   Osteoarthritis Mother    Diabetes Mother    Heart disease Mother    Arthritis Mother    COPD Father    Throat cancer Father    Cancer Father    Heart disease Father    Cancer Sister        Breast   Breast cancer Sister        Early 27's   Cancer Brother        Prostate   Cancer Sister        Breast   Breast cancer Sister        Early 35's   Charcot-Marie-Tooth disease Sister    Past Surgical History:  Procedure Laterality Date   ABDOMINAL HYSTERECTOMY  2000   Social History   Tobacco Use   Smoking status: Never  Passive exposure: Never   Smokeless tobacco: Never  Vaping Use   Vaping status: Never Used  Substance Use Topics   Alcohol use: No    Alcohol/week: 0.0 standard drinks of alcohol   Drug use: No   Social History   Social History Narrative   Married   1 year of college   [employer] healthcare- Surgery scheduler    No Children    Caffeine- coffee 2 cups, tea prn   Counseling- 17 years ago after mother passing      Immunization History  Administered Date(s) Administered   Influenza,inj,Quad PF,6+ Mos 03/18/2016, 03/12/2017, 03/24/2018, 01/25/2019   Tdap 05/28/2014     Objective: Vital Signs: BP 107/71   Pulse 70   Temp (!) 97.1 F (36.2 C)   Resp 13   Ht 5' 6 (1.676 m)   Wt 149 lb 9.6 oz (67.9 kg)   BMI 24.15 kg/m     Physical Exam Vitals and nursing note reviewed.  Constitutional:      Appearance: She is well-developed.  HENT:     Head: Normocephalic and atraumatic.  Eyes:     Conjunctiva/sclera: Conjunctivae normal.  Cardiovascular:     Rate and Rhythm: Normal rate and regular rhythm.     Heart sounds: Normal heart sounds.  Pulmonary:     Effort: Pulmonary effort is normal.     Breath sounds: Normal breath sounds.  Abdominal:     General: Bowel sounds are normal.     Palpations: Abdomen is soft.  Musculoskeletal:     Cervical back: Normal range of motion.  Skin:    General: Skin is warm and dry.     Capillary Refill: Capillary refill takes less than 2 seconds.  Neurological:     Mental Status: She is alert and oriented to person, place, and time.  Psychiatric:        Behavior: Behavior normal.      Musculoskeletal Exam: Cervical, thoracic and lumbar spine were in good range of motion.  She had tenderness in the lower thoracic region.  There was no SI joint tenderness.  Shoulder joints, elbow joints, wrist joints, MCPs, PIPs and DIPs were in good range of motion with no synovitis.  Hip joints and knee joints were in good range of motion without any warmth swelling or effusion.  Tenderness over left left trochanteric bursa.  There was no tenderness over ankles or MTPs.   CDAI Exam: CDAI Score: -- Patient Global: --; Provider Global: -- Swollen: --; Tender: -- Joint Exam 04/27/2024   No joint exam has been documented for this visit   There is currently no information documented on the homunculus. Go to the Rheumatology activity and complete the homunculus joint exam.  Investigation: No additional findings.  Imaging: XR KNEE 3 VIEW LEFT Result Date: 04/27/2024 Moderate medial compartment narrowing was noted.  Mild patellofemoral narrowing was noted.  No chondrocalcinosis was noted. Impression: These findings suggestive of moderate osteoarthritis and mild chondromalacia  patella.  XR Thoracic Spine 2 View Result Date: 04/27/2024 Thoracic levoscoliosis was noted.  Multilevel spondylosis with the anterior osteophytes was noted.  A large osteophyte was noted at T11.  Most prominent narrowing was noted between T10 and T11. 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   Recent Labs: Lab Results  Component Value Date   WBC 7.0 08/14/2019   HGB 13.2 08/14/2019   PLT 309.0 08/14/2019   NA 138 08/14/2019   K 3.9 08/14/2019  CL 102 08/14/2019   CO2 29 08/14/2019   GLUCOSE 75 08/14/2019   BUN 15 08/14/2019   CREATININE 0.86 08/14/2019   BILITOT 0.3 08/14/2019   ALKPHOS 65 08/14/2019   AST 24 08/14/2019   ALT 17 08/14/2019   PROT 7.0 08/14/2019   ALBUMIN 4.1 08/14/2019   CALCIUM 9.4 08/14/2019    Speciality Comments: No specialty comments available.  Procedures:  Large Joint Inj: L greater trochanter on 04/27/2024 3:35 PM Indications: pain Details: 27 G 1.5 in needle, lateral approach  Arthrogram: No  Medications: 40 mg triamcinolone  acetonide 40 MG/ML; 1.5 mL lidocaine  1 % Aspirate: 0 mL Outcome: tolerated well, no immediate complications  Risk of infection, tendon injury, nerve injury, hypopigmentation and dermal atrophy were discussed. Procedure, treatment alternatives, risks and benefits explained, specific risks discussed. Consent was given by the patient. Immediately prior to procedure a time out was called to verify the correct patient, procedure, equipment, support staff and site/side marked as required. Patient was prepped and draped in the usual sterile fashion.     Allergies: Patient has no known allergies.   Assessment / Plan:     Visit Diagnoses: Trochanteric bursitis, left hip-she has been having increased pain and discomfort over the left trochanteric bursa.  She has been having nocturnal pain.  She has pain radiating into her left left knee.  After different treatment  options were discussed and side effects were explained left trochanteric bursa was injected with lidocaine  and Kenalog  as described above.  She tolerated the procedure well.  Postprocedure instructions were given.  A handout on IT band stretches was given.  Chronic pain of both hips -she has intermittent discomfort in her hip joints.  X-rays of bilateral hip joints were unremarkable in the past.  Chronic pain of left knee -she has been having increased pain and discomfort with left knee joint.  No warmth swelling or effusion was noted.  Plan: XR KNEE 3 VIEW LEFT.  X-ray showed moderate osteoarthritis and mild chondromalacia patella.  X-ray findings were reviewed with the patient.  A handout on knee joint exercise was given.  Primary osteoarthritis of both knees - She has mild osteoarthritis and moderate chondromalacia patella on the x-rays from 2020.  Primary osteoarthritis of both hands-PIP and DIP thickening was noted.  Pain in thoracic spine -patient has been experiencing discomfort in the thoracic region.  She has seen a chiropractor without much relief.  She gives history of intermittent thoracic pain.  Plan: XR Thoracic Spine 2 View.  X-ray showed levoscoliosis, multilevel spondylosis and a large osteophyte at T10 and T11.  She has been having intermittent discomfort in that region.  She denies any radiculopathy.  Will try physical therapy.  If she has persistent symptoms we may consider MRI of the thoracic spine.  X-ray findings were reviewed with the patient.  Orders: Orders Placed This Encounter  Procedures   Large Joint Inj: L greater trochanter   XR Thoracic Spine 2 View   XR KNEE 3 VIEW LEFT   Ambulatory referral to Physical Therapy   No orders of the defined types were placed in this encounter.    Follow-Up Instructions: Return in about 6 months (around 10/26/2024) for Osteoarthritis.   Maya Nash, MD  Note - This record has been created using Animal nutritionist.  Chart  creation errors have been sought, but may not always  have been located. Such creation errors do not reflect on  the standard of medical care.

## 2024-04-18 ENCOUNTER — Ambulatory Visit: Payer: Self-pay | Admitting: Rheumatology

## 2024-04-27 ENCOUNTER — Encounter: Payer: Self-pay | Admitting: Rheumatology

## 2024-04-27 ENCOUNTER — Ambulatory Visit: Attending: Rheumatology | Admitting: Rheumatology

## 2024-04-27 ENCOUNTER — Ambulatory Visit

## 2024-04-27 DIAGNOSIS — M19041 Primary osteoarthritis, right hand: Secondary | ICD-10-CM | POA: Diagnosis not present

## 2024-04-27 DIAGNOSIS — G8929 Other chronic pain: Secondary | ICD-10-CM

## 2024-04-27 DIAGNOSIS — M19042 Primary osteoarthritis, left hand: Secondary | ICD-10-CM | POA: Diagnosis not present

## 2024-04-27 DIAGNOSIS — M25551 Pain in right hip: Secondary | ICD-10-CM | POA: Diagnosis not present

## 2024-04-27 DIAGNOSIS — M7062 Trochanteric bursitis, left hip: Secondary | ICD-10-CM | POA: Diagnosis not present

## 2024-04-27 DIAGNOSIS — M546 Pain in thoracic spine: Secondary | ICD-10-CM

## 2024-04-27 DIAGNOSIS — M25552 Pain in left hip: Secondary | ICD-10-CM | POA: Diagnosis not present

## 2024-04-27 DIAGNOSIS — M25562 Pain in left knee: Secondary | ICD-10-CM

## 2024-04-27 DIAGNOSIS — M170 Bilateral primary osteoarthritis of knee: Secondary | ICD-10-CM

## 2024-04-27 MED ORDER — TRIAMCINOLONE ACETONIDE 40 MG/ML IJ SUSP
40.0000 mg | INTRAMUSCULAR | Status: AC | PRN
  Administered 2024-04-27: 40 mg via INTRA_ARTICULAR

## 2024-04-27 MED ORDER — LIDOCAINE HCL 1 % IJ SOLN
1.5000 mL | INTRAMUSCULAR | Status: AC | PRN
  Administered 2024-04-27: 1.5 mL

## 2024-04-27 NOTE — Patient Instructions (Signed)
Exercises for Chronic Knee Pain Chronic knee pain is pain that lasts longer than 3 months. For most people with chronic knee pain, exercise and weight loss is an important part of treatment. Your health care provider may want you to focus on: Making the muscles that support your knee stronger. This can take pressure off your knee and reduce pain. Preventing knee stiffness. How far you can move your knee, keeping it there or making it farther. Losing weight (if this applies) to take pressure off your knee, lower your risk for injury, and make it easier for you to exercise. Your provider will help you make an exercise program that fits your needs and physical abilities. Below are simple, low-impact exercises you can do at home. Ask your provider or physical therapist how often you should do your exercise program and how many times to repeat each exercise. General safety tips  Get your provider's approval before doing any exercises. Start slowly and stop any time you feel pain. Do not exercise if your knee pain is flaring up. Warm up first. Stretching a cold muscle can cause an injury. Do 5-10 minutes of easy movement or light stretching before beginning your exercises. Do 5-10 minutes of low-impact activity (like walking or cycling) before starting strengthening exercises. Contact your provider any time you have pain during or after exercising. Exercise can cause discomfort but should not be painful. It is normal to be a little stiff or sore after exercising. Stretching and range-of-motion exercises Front thigh stretch  Stand up straight and support your body by holding on to a chair or resting one hand on a wall. With your legs straight and close together, bend one knee to lift your heel up toward your butt. Using one hand for support, grab your ankle with your free hand. Pull your foot up closer toward your butt to feel the stretch in front of your thigh. Hold the stretch for 30  seconds. Repeat __________ times. Complete this exercise __________ times a day. Back thigh stretch  Sit on the floor with your back straight and your legs out straight in front of you. Place the palms of your hands on the floor and slide them toward your feet as you bend at the hip. Try to touch your nose to your knees and feel the stretch in the back of your thighs. Hold for 30 seconds. Repeat __________ times. Complete this exercise __________ times a day. Calf stretch  Stand facing a wall. Place the palms of your hands flat against the wall, arms extended, and lean slightly against the wall. Get into a lunge position with one leg bent at the knee and the other leg stretched out straight behind you. Keep both feet facing the wall and increase the bend in your knee while keeping the heel of the other leg flat on the ground. You should feel the stretch in your calf. Hold for 30 seconds. Repeat __________ times. Complete this exercise __________ times a day. Strengthening exercises Straight leg lift  Lie on your back with one knee bent and the other leg out straight. Slowly lift the straight leg without bending the knee. Lift until your foot is about 12 inches (30 cm) off the floor. Hold for 3-5 seconds and slowly lower your leg. Repeat __________ times. Complete this exercise __________ times a day. Single leg dip  Stand between two chairs and put both hands on the backs of the chairs for support. Extend one leg out straight with your body  weight resting on the heel of the standing leg. Slowly bend your standing knee to dip your body to the level that is comfortable for you. Hold for 3-5 seconds. Repeat __________ times. Complete this exercise __________ times a day. Hamstring curls  Stand straight, knees close together, facing the back of a chair. Hold on to the back of a chair with both hands. Keep one leg straight. Bend the other knee while bringing the heel up toward the butt  until the knee is bent at a 90-degree angle (right angle). Hold for 3-5 seconds. Repeat __________ times. Complete this exercise __________ times a day. Wall squat  Stand straight with your back, hips, and head against a wall. Step forward one foot at a time with your back still against the wall. Your feet should be 2 feet (61 cm) from the wall at shoulder width. Keeping your back, hips, and head against the wall, slide down the wall to as close to a sitting position as you can get. Hold for 5-10 seconds, then slowly slide back up. Repeat __________ times. Complete this exercise __________ times a day. Step-ups  Stand in front of a sturdy platform or stool that is about 6 inches (15 cm) high. Slowly step up with your left / right foot, keeping your knee in line with your hip and foot. Do not let your knee bend so far that you cannot see your toes. Hold on to a chair for balance, but do not use it for support. Slowly unlock your knee and lower yourself to the starting position. Repeat __________ times. Complete this exercise __________ times a day. Contact a health care provider if: Your exercises cause pain. Your pain is worse after you exercise. Your pain prevents you from doing your exercises. This information is not intended to replace advice given to you by your health care provider. Make sure you discuss any questions you have with your health care provider. Document Revised: 05/19/2022 Document Reviewed: 05/19/2022 [employer] Patient Education  2024 [employer]   Iliotibial Band Syndrome Rehab Ask your health care provider which exercises are safe for you. Do exercises exactly as told by your provider and adjust them as told. It's normal to feel mild stretching, pulling, tightness, or discomfort as you do these exercises. Stop right away if you feel sudden pain or your pain gets a lot worse. Do not begin these exercises until told by your provider. Stretching and range-of-motion  exercises These exercises warm up your muscles and joints. They also improve the movement and flexibility of your hip and pelvis. Quadriceps stretch, prone  Lie face down (prone) on a firm surface like a bed or padded floor. Bend your left / right knee. Reach back to hold your ankle or pant leg. If you can't reach your ankle or pant leg, use a belt looped around your foot and grab the belt instead. Gently pull your heel toward your butt. Your knee should not slide out to the side. You should feel a stretch in the front of your thigh and knee, also called the quadriceps. Hold this position for __________ seconds. Repeat __________ times. Complete this exercise __________ times a day. Iliotibial band stretch The iliotibial band is a strip of tissue that runs along the outside of your hip down to your knee. Lie on your side with your left / right leg on top. Bend both knees and grab your left / right ankle. Stretch out your bottom arm to help you balance. Slowly bring  your top knee back so your thigh goes behind your back. Slowly lower your top leg toward the floor until you feel a gentle stretch on the outside of your left / right hip and thigh. If you don't feel a stretch and your knee won't go farther, place the heel of your other foot on top of your knee and pull your knee down toward the floor with your foot. Hold this position for __________ seconds. Repeat __________ times. Complete this exercise __________ times a day. Strengthening exercises These exercises build strength and endurance in your hip and pelvis. Endurance means your muscles can keep working even when they're tired. Straight leg raises, side-lying This exercise strengthens the muscles that rotate the leg at the hip and move it away from your body. These muscles are called hip abductors. Lie on your side with your left / right leg on top. Lie so your head, shoulder, hip, and knee line up. You can bend your bottom knee to help  you balance. Roll your hips slightly forward so they're stacked directly over each other. Your left / right knee should face forward. Tense the muscles in your outer thigh and hip. Lift your top leg 4-6 inches (10-15 cm) off the ground. Hold this position for __________ seconds. Slowly lower your leg back down to the starting position. Let your muscles fully relax before doing this exercise again. Repeat __________ times. Complete this exercise __________ times a day. Leg raises, prone This exercise strengthens the muscles that move the hips backward. These muscles are called hip extensors. Lie face down (prone) on your bed or a firm surface. You can put a pillow under your hips for comfort and to support your lower back. Bend your left / right knee so your foot points straight up toward the ceiling. Keep the other leg straight and behind you. Squeeze your butt muscles. Lift your left / right thigh off the firm surface. Do not let your back arch. Tense your thigh muscle as hard as you can without having more knee pain. Hold this position for __________ seconds. Slowly lower your leg to the starting position. Allow your leg to relax all the way. Repeat __________ times. Complete this exercise __________ times a day. Hip hike  Stand sideways on a bottom step. Place your feet so that your left / right leg is on the step, and the other foot is hanging off the side. If you need support for balance, hold onto a railing or wall. Keep your knees straight and your abdomen square, meaning your hips are level. Then, lift your left / right hip up toward the ceiling. Slowly let your leg that's hanging off the step lower towards the floor. Your foot should get closer to the ground. Do not lean or bend your knees during this movement. Repeat __________ times. Complete this exercise __________ times a day. This information is not intended to replace advice given to you by your health care provider. Make sure  you discuss any questions you have with your health care provider. Document Revised: 07/17/2022 Document Reviewed: 07/17/2022 [employer] Patient Education  2024 [employer].

## 2024-05-25 ENCOUNTER — Telehealth: Payer: Self-pay | Admitting: Rheumatology

## 2024-05-25 NOTE — Telephone Encounter (Signed)
 Pt called stating the [facility] therapy referral Dr. Dolphus sent cost way to much for her. Pt would like to go somewhere else that may be cheaper. Pt would like a call back to discuss some options.

## 2024-05-25 NOTE — Telephone Encounter (Signed)
 LMOM for patient to call insurance and local PT to check on cost. Patient should return call to let office know where she wants to be referred to.

## 2024-06-05 ENCOUNTER — Ambulatory Visit

## 2024-06-08 ENCOUNTER — Ambulatory Visit

## 2024-06-12 ENCOUNTER — Ambulatory Visit

## 2024-06-13 ENCOUNTER — Ambulatory Visit

## 2024-06-14 ENCOUNTER — Ambulatory Visit

## 2024-06-15 ENCOUNTER — Ambulatory Visit

## 2024-06-19 ENCOUNTER — Ambulatory Visit

## 2024-06-20 ENCOUNTER — Ambulatory Visit

## 2024-06-21 ENCOUNTER — Ambulatory Visit

## 2024-06-22 ENCOUNTER — Ambulatory Visit

## 2024-06-26 ENCOUNTER — Ambulatory Visit

## 2024-06-27 ENCOUNTER — Ambulatory Visit

## 2024-06-28 ENCOUNTER — Ambulatory Visit

## 2024-06-29 ENCOUNTER — Ambulatory Visit

## 2024-07-03 ENCOUNTER — Ambulatory Visit

## 2024-07-04 ENCOUNTER — Ambulatory Visit

## 2024-07-05 ENCOUNTER — Ambulatory Visit

## 2024-07-06 ENCOUNTER — Ambulatory Visit

## 2024-07-10 ENCOUNTER — Ambulatory Visit

## 2024-07-11 ENCOUNTER — Ambulatory Visit

## 2024-07-12 ENCOUNTER — Ambulatory Visit

## 2024-07-13 ENCOUNTER — Ambulatory Visit

## 2024-07-17 ENCOUNTER — Ambulatory Visit

## 2024-07-18 ENCOUNTER — Ambulatory Visit

## 2024-07-19 ENCOUNTER — Ambulatory Visit

## 2024-07-20 ENCOUNTER — Ambulatory Visit

## 2024-10-26 ENCOUNTER — Ambulatory Visit: Admitting: Rheumatology
